# Patient Record
Sex: Male | Born: 1950
Health system: Southern US, Community
[De-identification: ages and names within clinical notes are randomized; demographics above are authoritative.]

## PROBLEM LIST (undated history)

## (undated) DIAGNOSIS — E785 Hyperlipidemia, unspecified: Secondary | ICD-10-CM

## (undated) DIAGNOSIS — T7840XA Allergy, unspecified, initial encounter: Secondary | ICD-10-CM

## (undated) HISTORY — PX: CYSTECTOMY: SUR359

## (undated) HISTORY — DX: Hyperlipidemia, unspecified: E78.5

## (undated) HISTORY — PX: KNEE SURGERY: SHX244

## (undated) HISTORY — DX: Allergy, unspecified, initial encounter: T78.40XA

---

## 2009-08-02 ENCOUNTER — Ambulatory Visit (HOSPITAL_COMMUNITY): Admission: RE | Admit: 2009-08-02 | Discharge: 2009-08-02 | Payer: Self-pay | Admitting: Sports Medicine

## 2009-08-08 ENCOUNTER — Ambulatory Visit (HOSPITAL_BASED_OUTPATIENT_CLINIC_OR_DEPARTMENT_OTHER): Admission: RE | Admit: 2009-08-08 | Discharge: 2009-08-08 | Payer: Self-pay | Admitting: Orthopedic Surgery

## 2009-10-09 ENCOUNTER — Ambulatory Visit: Payer: Self-pay | Admitting: Internal Medicine

## 2009-10-09 DIAGNOSIS — R030 Elevated blood-pressure reading, without diagnosis of hypertension: Secondary | ICD-10-CM

## 2009-10-09 DIAGNOSIS — J309 Allergic rhinitis, unspecified: Secondary | ICD-10-CM

## 2009-10-09 DIAGNOSIS — E785 Hyperlipidemia, unspecified: Secondary | ICD-10-CM

## 2009-10-09 LAB — HM COLONOSCOPY

## 2010-05-28 NOTE — Assessment & Plan Note (Signed)
Summary: to be est/njr   Vital Signs:  Patient profile:   60 year old male Height:      69 inches Weight:      200 pounds BMI:     29.64 Pulse rate:   58 / minute Pulse rhythm:   regular Resp:     12 per minute BP sitting:   148 / 80  (left arm) Cuff size:   regular  Vitals Entered By: Gladis Riffle, RN (October 09, 2009 9:16 AM)  Nutrition Counseling: Patient's BMI is greater than 25 and therefore counseled on weight management options. CC: to establish, moved from [place]--BP 135-140/85-90, cholesterol 220--knee surgery 6 weeks ago Is Patient Diabetic? No   CC:  to establish, moved from [place]--BP 135-140/85-90, and cholesterol 220--knee surgery 6 weeks ago.  History of Present Illness: new patient  lipids: cholesterol has been as high as 250---has been trying to lose weight  BP---gradually increasing  knee surgery---miniscus tear  All other systems reviewed and were negative   Preventive Screening-Counseling & Management  Alcohol-Tobacco     Smoking Status: quit     Year Started: 1974     Year Quit: 1982  Current Problems (verified): 1)  Allergic Rhinitis  (ICD-477.9)  Current Medications (verified): 1)  None  Allergies (verified): No Known Drug Allergies  Past History:  Past Medical History: Allergic rhinitis Hyperlipidemia  Past Surgical History: right knee meniscus 2011 pterygium cyst---eyelid  Family History: father deceased 4 ("old age") mother deceased Gerilyn Pilgrim [place]--60 yo  Social History: married 2 sons---healthy Former Smoker---quit 1980 Alcohol use-yes 2 glasses/week 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

## 2010-07-17 LAB — POCT HEMOGLOBIN-HEMACUE: Hemoglobin: 14.7 g/dL (ref 13.0–17.0)

## 2010-11-27 ENCOUNTER — Other Ambulatory Visit (INDEPENDENT_AMBULATORY_CARE_PROVIDER_SITE_OTHER): Payer: 59

## 2010-11-27 DIAGNOSIS — Z0000 Encounter for general adult medical examination without abnormal findings: Secondary | ICD-10-CM

## 2010-11-27 LAB — CBC WITH DIFFERENTIAL/PLATELET
Basophils Absolute: 0 10*3/uL (ref 0.0–0.1)
Basophils Relative: 0.6 % (ref 0.0–3.0)
Eosinophils Absolute: 0.3 10*3/uL (ref 0.0–0.7)
Eosinophils Relative: 5.5 % — ABNORMAL HIGH (ref 0.0–5.0)
Lymphs Abs: 1.1 10*3/uL (ref 0.7–4.0)
MCV: 93.5 fl (ref 78.0–100.0)
Neutro Abs: 3.9 10*3/uL (ref 1.4–7.7)
Platelets: 208 10*3/uL (ref 150.0–400.0)

## 2010-11-27 LAB — BASIC METABOLIC PANEL
BUN: 19 mg/dL (ref 6–23)
CO2: 31 mEq/L (ref 19–32)
Creatinine, Ser: 1 mg/dL (ref 0.4–1.5)
GFR: 78.23 mL/min (ref >60.00)
Sodium: 142 mEq/L (ref 135–145)

## 2010-11-27 LAB — LIPID PANEL: Cholesterol: 263 mg/dL — ABNORMAL HIGH (ref 0–200)

## 2010-11-27 LAB — POCT URINALYSIS DIPSTICK
Bilirubin, UA: NEGATIVE
Glucose, UA: NEGATIVE
Ketones, UA: NEGATIVE
Urobilinogen, UA: 0.2
pH, UA: 7

## 2010-11-27 LAB — TSH: TSH: 2.1 u[IU]/mL (ref 0.35–5.50)

## 2010-11-28 LAB — HEPATIC FUNCTION PANEL
AST: 21 U/L (ref 0–37)
Alkaline Phosphatase: 57 U/L (ref 39–117)
Total Bilirubin: 0.6 mg/dL (ref 0.3–1.2)

## 2010-11-29 ENCOUNTER — Encounter: Payer: Self-pay | Admitting: Internal Medicine

## 2010-12-04 ENCOUNTER — Encounter: Payer: Self-pay | Admitting: Internal Medicine

## 2010-12-04 ENCOUNTER — Ambulatory Visit (INDEPENDENT_AMBULATORY_CARE_PROVIDER_SITE_OTHER): Payer: 59 | Admitting: Internal Medicine

## 2010-12-04 VITALS — BP 140/85 | HR 72 | Temp 98.9°F | Ht 68.5 in | Wt 196.0 lb

## 2010-12-04 DIAGNOSIS — E785 Hyperlipidemia, unspecified: Secondary | ICD-10-CM

## 2010-12-04 DIAGNOSIS — Z0000 Encounter for general adult medical examination without abnormal findings: Secondary | ICD-10-CM

## 2010-12-04 MED ORDER — TADALAFIL 20 MG PO TABS: 20.0000 mg | ORAL_TABLET | ORAL | Status: DC | PRN

## 2010-12-04 NOTE — Progress Notes (Signed)
  Subjective:    Patient ID: Cameron Aguilar, male    DOB: [birth year], 60 y.o.   MRN: [id number]  HPI cpx  Home bps 120s/70s He feels well except for recent URI sxs  Past Medical History  Diagnosis Date  . Allergy   . Hyperlipidemia    Past Surgical History  Procedure Date  . Knee surgery   . Cystectomy     eyelid    reports that he quit smoking about 32 years ago. He does not have any smokeless tobacco history on file. He reports that he drinks alcohol. He reports that he does not use illicit drugs. family history is not on file. Not on File    Review of Systems  patient denies chest pain, shortness of breath, orthopnea. Denies lower extremity edema, abdominal pain, change in appetite, change in bowel movements. Patient denies rashes, musculoskeletal complaints. No other specific complaints in a complete review of systems.      Objective:   Physical Exam        Assessment & Plan:

## 2011-01-13 ENCOUNTER — Encounter: Payer: Self-pay | Admitting: Family Medicine

## 2011-01-13 ENCOUNTER — Ambulatory Visit (INDEPENDENT_AMBULATORY_CARE_PROVIDER_SITE_OTHER): Payer: 59 | Admitting: Family Medicine

## 2011-01-13 VITALS — BP 159/94 | Ht 70.0 in | Wt 195.0 lb

## 2011-01-13 DIAGNOSIS — M79609 Pain in unspecified limb: Secondary | ICD-10-CM

## 2011-01-13 DIAGNOSIS — M79669 Pain in unspecified lower leg: Secondary | ICD-10-CM

## 2011-01-13 NOTE — Progress Notes (Signed)
  Subjective:    Patient ID: Cameron Aguilar, male    DOB: 1950-08-13, 60 y.o.   MRN: [id number]  HPI  Right hand pain for 2 weeks. Started after a run of 2 miles. He completed 2 miles, stopped in turnaround to run back and felt a little bit of a pop and some instant pain in his calf. Was unable to run back and said he would have gladly take in a ride if one were available, but he did walk back. Did not notice bruising or swelling. The next day it felt stiff and he had some pain with going up stairs. It has gradually improved. Yesterday he hiked  with only some mild stiffness. He wonders if it is safe to return to running.  Runs about 15 miles per week. Comb[place] PERTINENT  PMH / PSH: No prior history of calf or Achilles tendon injury. No prior history of right leg surgery. Review of Systems Denies fever, denies unusual weight change.    Objective:   Physical Exam  GENERAL: Well-developed male no acute distress Right calf: Mildly tender to palpation over a small quarter-sized area at the distal calf muscle, right above where the Achilles tendon junction starts. Plantar flexion and dorsiflexion strength are intact in either causes pain. GAIT: Heel [employer]. Normal stride.  ULTRASOUND: The Achilles tendon is without any defect or increased vascularity. The area of tenderness corresponds to a very small area in the soleus muscle that had some increased fluid content consistent with muscle edema. I do not see a lot of increased vascularity in this area. It does not seem to be a specific muscle defect.      Assessment & Plan:  Mild soleus strain on the right. I will start him on a eccentric calf raises. He should do these for about a week specifically focusing on single leg stance on the right,  trying to do 3 sets 2-3 times a day. After about a week of strengthening I think he can return to running. Should he have any other problems he can return to clinic.

## 2011-03-06 ENCOUNTER — Other Ambulatory Visit: Payer: 59

## 2011-03-27 ENCOUNTER — Other Ambulatory Visit (INDEPENDENT_AMBULATORY_CARE_PROVIDER_SITE_OTHER): Payer: 59

## 2011-03-27 DIAGNOSIS — E785 Hyperlipidemia, unspecified: Secondary | ICD-10-CM

## 2011-03-27 LAB — HEPATIC FUNCTION PANEL
ALT: 24 U/L (ref 0–53)
Albumin: 4.4 g/dL (ref 3.5–5.2)
Alkaline Phosphatase: 49 U/L (ref 39–117)
Total Protein: 7.1 g/dL (ref 6.0–8.3)

## 2011-03-27 LAB — LIPID PANEL
HDL: 57.2 mg/dL (ref >39.00)
VLDL: 20.8 mg/dL (ref 0.0–40.0)

## 2011-03-27 LAB — LDL CHOLESTEROL, DIRECT: Direct LDL: 198.2 mg/dL

## 2011-04-14 ENCOUNTER — Encounter: Payer: Self-pay | Admitting: *Deleted

## 2011-05-08 ENCOUNTER — Telehealth: Payer: Self-pay

## 2011-05-08 MED ORDER — ATORVASTATIN CALCIUM 40 MG PO TABS: 40.0000 mg | ORAL_TABLET | Freq: Every day | ORAL | Status: DC

## 2011-05-08 NOTE — Telephone Encounter (Signed)
Pt called requesting lab results.  Pt is aware and pt would like Lipitor 40 mg sent to [facility].  Pt aware rx sent.

## 2011-06-11 ENCOUNTER — Telehealth: Payer: Self-pay | Admitting: Internal Medicine

## 2011-06-11 ENCOUNTER — Other Ambulatory Visit: Payer: Self-pay | Admitting: Internal Medicine

## 2011-06-11 NOTE — Telephone Encounter (Signed)
rx sent in electronically 

## 2011-06-11 NOTE — Telephone Encounter (Signed)
Needs a refill called in for CIALIS, claims that Dr. Cato Mulligan has prescribed this for him before. Please call it into [facility] in [place]. Call pt. If there are any questions.

## 2012-03-04 ENCOUNTER — Other Ambulatory Visit: Payer: Self-pay | Admitting: Internal Medicine

## 2012-06-09 ENCOUNTER — Telehealth: Payer: Self-pay | Admitting: Internal Medicine

## 2012-06-09 MED ORDER — ATORVASTATIN CALCIUM 40 MG PO TABS: 40.0000 mg | ORAL_TABLET | Freq: Every day | ORAL | Status: DC

## 2012-06-09 MED ORDER — TADALAFIL 20 MG PO TABS: ORAL_TABLET | ORAL | Status: DC

## 2012-06-09 NOTE — Telephone Encounter (Signed)
rx sent in electronically 

## 2012-06-09 NOTE — Telephone Encounter (Signed)
Pt needs refill of :atorvastatin (LIPITOR) 40 MG tablet and CIALIS 20 MG tablet.  Pt made appt for follow up for these meds, first available April1, 2014. Is this ok? Or does he need to see Adline Mango. Pharm: [facility]/ [address]

## 2012-07-27 ENCOUNTER — Ambulatory Visit (INDEPENDENT_AMBULATORY_CARE_PROVIDER_SITE_OTHER): Payer: 59 | Admitting: Internal Medicine

## 2012-07-27 ENCOUNTER — Encounter: Payer: Self-pay | Admitting: Internal Medicine

## 2012-07-27 VITALS — BP 132/82 | HR 60 | Temp 98.0°F | Ht 70.0 in | Wt 198.0 lb

## 2012-07-27 DIAGNOSIS — R7309 Other abnormal glucose: Secondary | ICD-10-CM

## 2012-07-27 DIAGNOSIS — E785 Hyperlipidemia, unspecified: Secondary | ICD-10-CM

## 2012-07-27 DIAGNOSIS — R030 Elevated blood-pressure reading, without diagnosis of hypertension: Secondary | ICD-10-CM

## 2012-07-27 DIAGNOSIS — R739 Hyperglycemia, unspecified: Secondary | ICD-10-CM

## 2012-07-27 LAB — HEPATIC FUNCTION PANEL
ALT: 20 U/L (ref 0–53)
AST: 21 U/L (ref 0–37)
Alkaline Phosphatase: 56 U/L (ref 39–117)
Bilirubin, Direct: 0.1 mg/dL (ref 0.0–0.3)
Total Protein: 7.2 g/dL (ref 6.0–8.3)

## 2012-07-27 LAB — BASIC METABOLIC PANEL
Potassium: 4.8 mEq/L (ref 3.5–5.1)
Sodium: 138 mEq/L (ref 135–145)

## 2012-07-27 LAB — LDL CHOLESTEROL, DIRECT: Direct LDL: 131.3 mg/dL

## 2012-07-27 LAB — LIPID PANEL
Total CHOL/HDL Ratio: 4
VLDL: 16.6 mg/dL (ref 0.0–40.0)

## 2012-07-27 MED ORDER — TADALAFIL 20 MG PO TABS: ORAL_TABLET | ORAL | Status: DC

## 2012-07-27 MED ORDER — ATORVASTATIN CALCIUM 40 MG PO TABS: 40.0000 mg | ORAL_TABLET | Freq: Every day | ORAL | Status: DC

## 2012-07-27 NOTE — Progress Notes (Signed)
Patient ID: Cameron Aguilar, male   DOB: [birth year], 61 y.o.   MRN: [id number] Noncompliant with meds (lipitor) and f/u  He feels well and would like refills of cialis  Reviewed pmh, psh, meds  Ros: no complaints  Exam:  well-developed well-nourished male in no acute distress. HEENT exam atraumatic, normocephalic, neck supple without jugular venous distention. Chest clear to auscultation cardiac exam S1-S2 are regular. Abdominal exam overweight with bowel sounds, soft and nontender. Extremities no edema. Neurologic exam is alert with a normal gait.   A/P: ED- ok to use cialis

## 2012-07-28 NOTE — Assessment & Plan Note (Signed)
BP Readings from Last 3 Encounters:  07/27/12 132/82  01/13/11 159/94  12/04/10 140/85  fair control- continue meds

## 2012-07-28 NOTE — Assessment & Plan Note (Signed)
Needs to be compliant with meds Check labs today

## 2012-07-30 ENCOUNTER — Encounter: Payer: Self-pay | Admitting: *Deleted

## 2013-01-17 ENCOUNTER — Ambulatory Visit (INDEPENDENT_AMBULATORY_CARE_PROVIDER_SITE_OTHER): Payer: 59 | Admitting: Family Medicine

## 2013-01-17 ENCOUNTER — Encounter: Payer: Self-pay | Admitting: Family Medicine

## 2013-01-17 VITALS — BP 142/88 | HR 62 | Temp 97.7°F | Wt 202.0 lb

## 2013-01-17 DIAGNOSIS — J069 Acute upper respiratory infection, unspecified: Secondary | ICD-10-CM

## 2013-01-17 MED ORDER — HYDROCODONE-HOMATROPINE 5-1.5 MG/5ML PO SYRP: 5.0000 mL | ORAL_SOLUTION | Freq: Four times a day (QID) | ORAL | Status: AC | PRN

## 2013-01-17 NOTE — Patient Instructions (Signed)
Viral Syndrome  You or your child has Viral Syndrome. It is the most common infection causing "colds" and infections in the nose, throat, sinuses, and breathing tubes. Sometimes the infection causes nausea, vomiting, or diarrhea. The germ that causes the infection is a virus. No antibiotic or other medicine will kill it. There are medicines that you can take to make you or your child more comfortable.   HOME CARE INSTRUCTIONS    Rest in bed until you start to feel better.   If you have diarrhea or vomiting, eat small amounts of crackers and toast. Soup is helpful.   Do not give aspirin or medicine that contains aspirin to children.   Only take over-the-counter or prescription medicines for pain, discomfort, or fever as directed by your caregiver.  SEEK IMMEDIATE MEDICAL CARE IF:    You or your child has not improved within one week.   You or your child has pain that is not at least partially relieved by over-the-counter medicine.   Thick, colored mucus or blood is coughed up.   Discharge from the nose becomes thick yellow or green.   Diarrhea or vomiting gets worse.   There is any major change in your or your child's condition.   You or your child develops a skin rash, stiff neck, severe headache, or are unable to hold down food or fluid.   You or your child has an oral temperature above 102 F (38.9 C), not controlled by medicine.   Your baby is older than 3 months with a rectal temperature of 102 F (38.9 C) or higher.   Your baby is 3 months old or younger with a rectal temperature of 100.4 F (38 C) or higher.  Document Released: 03/30/2006 Document Revised: 07/07/2011 Document Reviewed: 03/31/2007  ExitCare Patient Information 2014 [employer].

## 2013-01-17 NOTE — Progress Notes (Signed)
  Subjective:    Patient ID: Cameron Aguilar, male    DOB: [birth year], 62 y.o.   MRN: [id number]  HPI Acute visit for upper respiratory illness Onset last Friday. Developed a sore throat along with body aches and nasal congestion. Now has dry cough. Question low-grade fever over the weekend. Actually feels somewhat better today. He's taken multiple over-the-counter medications for congestion with minimal relief. Cough is still bothersome at night. Patient is nonsmoker. Still has some body aches but overall improving. Denies any nausea, vomiting, or diarrhea.  He was exposed to son with recent symptom which were very similar  Past Medical History  Diagnosis Date  . Allergy   . Hyperlipidemia    Past Surgical History  Procedure Laterality Date  . Knee surgery    . Cystectomy      eyelid    reports that he quit smoking about 34 years ago. He does not have any smokeless tobacco history on file. He reports that  drinks alcohol. He reports that he does not use illicit drugs. family history includes Heart disease (age of onset: 79) in his paternal grandfather. No Known Allergies    Review of Systems  Constitutional: Positive for fatigue. Negative for fever and chills.  HENT: Positive for congestion and sore throat.   Respiratory: Positive for cough. Negative for shortness of breath and wheezing.        Objective:   Physical Exam  Constitutional: He appears well-developed and well-nourished.  HENT:  Right Ear: External ear normal.  Left Ear: External ear normal.  Mouth/Throat: Oropharynx is clear and moist.  Neck: Neck supple.  Cardiovascular: Normal rate and regular rhythm.   Pulmonary/Chest: Effort normal and breath sounds normal. No respiratory distress. He has no wheezes. He has no rales.          Assessment & Plan:  Viral URI. Hycodan cough syrup for nighttime use as needed. Continue over-the-counter analgesics as needed. Follow up when necessary

## 2013-03-21 ENCOUNTER — Emergency Department (HOSPITAL_COMMUNITY)
Admission: EM | Admit: 2013-03-21 | Discharge: 2013-03-21 | Disposition: A | Discharge status: Home / Self Care | Payer: 59 | Attending: Emergency Medicine | Admitting: Emergency Medicine

## 2013-03-21 ENCOUNTER — Encounter (HOSPITAL_COMMUNITY): Payer: Self-pay | Admitting: Emergency Medicine

## 2013-03-21 DIAGNOSIS — R040 Epistaxis: Secondary | ICD-10-CM | POA: Insufficient documentation

## 2013-03-21 DIAGNOSIS — Z87891 Personal history of nicotine dependence: Secondary | ICD-10-CM | POA: Insufficient documentation

## 2013-03-21 DIAGNOSIS — Z79899 Other long term (current) drug therapy: Secondary | ICD-10-CM | POA: Insufficient documentation

## 2013-03-21 DIAGNOSIS — E785 Hyperlipidemia, unspecified: Secondary | ICD-10-CM | POA: Insufficient documentation

## 2013-03-21 MED ORDER — OXYMETAZOLINE HCL 0.05 % NA SOLN
2.0000 | Freq: Once | NASAL | Status: AC
  Administered 2013-03-21: 2 via NASAL
  Filled 2013-03-21: qty 15

## 2013-03-21 NOTE — ED Notes (Signed)
Pt states he used nasal irrigation last night d/t stuffy nose. States around 2 hours ago left nare began bleeding, has not been able to stop the bleeding. Not on anticoagulants.

## 2013-03-21 NOTE — ED Notes (Signed)
Nose bleed x 2 hours, previous history same

## 2013-03-21 NOTE — ED Provider Notes (Signed)
CSN: [id number]     Arrival date & time 03/21/13  1330 History   First MD Initiated Contact with Patient 03/21/13 1341     Chief Complaint  Patient presents with  . Epistaxis    x 2 hrs   (Consider location/radiation/quality/duration/timing/severity/associated sxs/prior Treatment) Patient is a 62 y.o. male presenting with nosebleeds. The history is provided by the patient and medical records.  Epistaxis  This is a 62 year old male with a past medical history significant for seasonal allergies and hyperlipidemia presenting to the ED for epistaxis. Patient states he used a netty-pot nasal lavage kit last night because he felt like his nose was dry, no bleeding at that time.  States 2 hours ago he began having an atraumatic nosebleed from his left nares.  States initially it was pouring from his nose but has been applying direct pressure and bleeding has slowed.  Pt not currently on any anti-coagulants.  Denies any dizziness, lightheadedness, or feelings of syncope.  Past Medical History  Diagnosis Date  . Allergy   . Hyperlipidemia    Past Surgical History  Procedure Laterality Date  . Knee surgery    . Cystectomy      eyelid   Family History  Problem Relation Age of Onset  . Heart disease Paternal Grandfather 40    deceased in 20s from heart disease   History  Substance Use Topics  . Smoking status: Former Smoker    Quit date: 04/28/1978  . Smokeless tobacco: Not on file  . Alcohol Use: Yes     Comment: 2 glasses per week    Review of Systems  HENT: Positive for nosebleeds.   All other systems reviewed and are negative.    Allergies  Review of patient's allergies indicates no known allergies.  Home Medications   Current Outpatient Rx  Name  Route  Sig  Dispense  Refill  . atorvastatin (LIPITOR) 40 MG tablet   Oral   Take 1 tablet (40 mg total) by mouth daily.   90 tablet   3   . tadalafil (CIALIS) 20 MG tablet      TAKE 1 TABLET BY MOUTH EVERY OTHER DAY AS  NEEDED   10 tablet   11    BP 188/93  Pulse 64  Temp(Src) 97.5 F (36.4 C) (Oral)  Resp 16  SpO2 97%  Physical Exam  Nursing note and vitals reviewed. Constitutional: He is oriented to person, place, and time. He appears well-developed and well-nourished. No distress.  HENT:  Head: Normocephalic and atraumatic.  Nose: No nasal septal hematoma. Epistaxis is observed.  Mouth/Throat: Oropharynx is clear and moist.  Minimal bleeding of left anterior nares with clot present; no septal deformity or hematoma  Eyes: Conjunctivae and EOM are normal. Pupils are equal, round, and reactive to light.  Neck: Normal range of motion.  Cardiovascular: Normal rate, regular rhythm and normal heart sounds.   Pulmonary/Chest: Effort normal and breath sounds normal. No respiratory distress. He has no wheezes.  Musculoskeletal: Normal range of motion.  Neurological: He is alert and oriented to person, place, and time.  Skin: Skin is warm and dry. He is not diaphoretic.  Psychiatric: He has a normal mood and affect.    ED Course  Procedures (including critical care time) Labs Review Labs Reviewed - No data to display Imaging Review No results found.  EKG Interpretation   None       MDM   1. Epistaxis    Afrin used with good  resolution of bleeding. No signs/sx concerning for acute blood loss anemia. Pt instructed to continue using at home if nosebleed recurs, but not for more than 3 consecutive days.  FU with PCP if problems occur.  Discussed plan with pt, he agreed.  Return precautions advised.  Garlon Hatchet, PA-C 03/21/13 1444

## 2013-03-21 NOTE — ED Notes (Signed)
Voiced understanding of instructions  

## 2013-03-22 ENCOUNTER — Other Ambulatory Visit: Payer: Self-pay | Admitting: *Deleted

## 2013-03-22 MED ORDER — ATORVASTATIN CALCIUM 40 MG PO TABS: 40.0000 mg | ORAL_TABLET | Freq: Every morning | ORAL | Status: DC

## 2013-03-23 NOTE — ED Provider Notes (Signed)
Medical screening examination/treatment/procedure(s) were performed by non-physician practitioner and as supervising physician I was immediately available for consultation/collaboration.  EKG Interpretation   None         Laray Anger, DO 03/23/13 2159

## 2013-05-27 ENCOUNTER — Encounter: Payer: Self-pay | Admitting: Family

## 2013-05-27 ENCOUNTER — Ambulatory Visit (INDEPENDENT_AMBULATORY_CARE_PROVIDER_SITE_OTHER): Payer: 59 | Admitting: Family

## 2013-05-27 VITALS — BP 132/84 | HR 93 | Temp 100.9°F | Wt 206.0 lb

## 2013-05-27 DIAGNOSIS — R509 Fever, unspecified: Secondary | ICD-10-CM

## 2013-05-27 DIAGNOSIS — J111 Influenza due to unidentified influenza virus with other respiratory manifestations: Secondary | ICD-10-CM

## 2013-05-27 DIAGNOSIS — R6889 Other general symptoms and signs: Secondary | ICD-10-CM

## 2013-05-27 LAB — POCT INFLUENZA A/B
INFLUENZA A, POC: POSITIVE
INFLUENZA B, POC: POSITIVE

## 2013-05-27 MED ORDER — OSELTAMIVIR PHOSPHATE 75 MG PO CAPS: 75.0000 mg | ORAL_CAPSULE | Freq: Two times a day (BID) | ORAL | Status: DC

## 2013-05-27 MED ORDER — GUAIFENESIN-CODEINE 100-10 MG/5ML PO SYRP: 5.0000 mL | ORAL_SOLUTION | Freq: Three times a day (TID) | ORAL | Status: DC | PRN

## 2013-05-27 NOTE — Patient Instructions (Signed)

## 2013-05-27 NOTE — Progress Notes (Signed)
   Subjective:    Patient ID: Cameron Aguilar, male    DOB: Mar 18, 1951, 63 y.o.   MRN: [id number]  HPI 63 year old white male, nonsmoker, patient of doctors for this event today with complaints of fever, cough, congestion, muscle aches and pain x3 days. Over-the-counter Mucinex without much relief. Travels on airplanes frequently.   Review of Systems  Constitutional: Positive for fever, chills and fatigue.  HENT: Negative.   Respiratory: Negative.   Cardiovascular: Negative.   Gastrointestinal: Negative.   Genitourinary: Negative.   Musculoskeletal: Positive for myalgias.  Skin: Negative.   Neurological: Negative.   Psychiatric/Behavioral: Negative.    Past Medical History  Diagnosis Date  . Allergy   . Hyperlipidemia     History   Social History  . Marital Status: Married    Spouse Name: N/A    Number of Children: N/A  . Years of Education: N/A   Occupational History  . Not on file.   Social History Main Topics  . Smoking status: Former Smoker    Quit date: 04/28/1978  . Smokeless tobacco: Not on file  . Alcohol Use: Yes     Comment: 2 glasses per week  . Drug Use: No  . Sexual Activity:    Other Topics Concern  . Not on file   Social History Narrative  . No narrative on file    Past Surgical History  Procedure Laterality Date  . Knee surgery    . Cystectomy      eyelid    Family History  Problem Relation Age of Onset  . Heart disease Paternal Grandfather 3    deceased in 107s from heart disease    No Known Allergies  Current Outpatient Prescriptions on File Prior to Visit  Medication Sig Dispense Refill  . atorvastatin (LIPITOR) 40 MG tablet Take 1 tablet (40 mg total) by mouth every morning.  90 tablet  1  . polyvinyl alcohol (LIQUIFILM TEARS) 1.4 % ophthalmic solution Place 2-3 drops into both eyes daily as needed for dry eyes.      . tadalafil (CIALIS) 20 MG tablet Take 20 mg by mouth daily as needed for erectile dysfunction.       No current  facility-administered medications on file prior to visit.    BP 132/84  Pulse 93  Temp(Src) 100.9 F (38.3 C) (Oral)  Wt 206 lb (93.441 kg)chart    Objective:   Physical Exam  Constitutional: He is oriented to person, place, and time. He appears well-developed.  Appears ill  HENT:  Right Ear: External ear normal.  Left Ear: External ear normal.  Nose: Nose normal.  Mouth/Throat: Oropharynx is clear and moist.  Neck: Normal range of motion. Neck supple.  Cardiovascular: Normal rate, regular rhythm and normal heart sounds.   Pulmonary/Chest: Effort normal and breath sounds normal.  Neurological: He is alert and oriented to person, place, and time.  Skin: Skin is warm and dry.  Psychiatric: He has a normal mood and affect.          Assessment & Plan:  Assessment: 1. Influenza-Tamiflu at patient's request 75 mg twice a day x5 days. Cheratussin 1 teaspoon every 8 hours as needed. Warned of drowsiness. 2. Fever-alternate ibuprofen and Tylenol as needed for fever

## 2013-05-27 NOTE — Progress Notes (Signed)
Pre visit review using our clinic review tool, if applicable. No additional management support is needed unless otherwise documented below in the visit note. 

## 2013-08-29 ENCOUNTER — Other Ambulatory Visit: Payer: Self-pay | Admitting: Internal Medicine

## 2013-11-08 ENCOUNTER — Encounter: Payer: Self-pay | Admitting: Physician Assistant

## 2013-11-08 ENCOUNTER — Ambulatory Visit (INDEPENDENT_AMBULATORY_CARE_PROVIDER_SITE_OTHER): Payer: 59 | Admitting: Physician Assistant

## 2013-11-08 VITALS — BP 132/76 | HR 66 | Temp 98.3°F | Resp 18 | Wt 208.0 lb

## 2013-11-08 DIAGNOSIS — J029 Acute pharyngitis, unspecified: Secondary | ICD-10-CM

## 2013-11-08 DIAGNOSIS — L989 Disorder of the skin and subcutaneous tissue, unspecified: Secondary | ICD-10-CM

## 2013-11-08 LAB — POCT RAPID STREP A (OFFICE): Rapid Strep A Screen: NEGATIVE

## 2013-11-08 MED ORDER — MOUTHWASH/GARGLE MT LIQD: OROMUCOSAL | Status: DC

## 2013-11-08 NOTE — Progress Notes (Signed)
Subjective:    Patient ID: Cameron Aguilar, male    DOB: 1950/11/10, 63 y.o.   MRN: [id number]  Sore Throat  This is a new problem. The current episode started in the past 7 days (3 days). The problem has been gradually worsening. Neither side of throat is experiencing more pain than the other. There has been no fever. The pain is at a severity of 3/10. The pain is mild. Associated symptoms include a hoarse voice and trouble swallowing. Pertinent negatives include no abdominal pain, diarrhea, drooling, ear discharge, ear pain, headaches, plugged ear sensation, neck pain, shortness of breath, stridor, swollen glands or vomiting. He has had no exposure to strep (Pts wife had similar a week ago.) or mono. Treatments tried: Alka Selzter Night time, benadryl. The treatment provided mild relief.    Pt also complaining of a wart on skin of leg. Present for about a month to 6 weeks. He states that he has scratched it and it bleeding. No pain, unless being palpated. Left lower leg. Has not tried anything for this.   Review of Systems  Constitutional: Negative for fever and chills.  HENT: Positive for hoarse voice, postnasal drip and trouble swallowing. Negative for drooling, ear discharge, ear pain and sinus pressure.   Respiratory: Negative for shortness of breath and stridor.   Cardiovascular: Negative for chest pain.  Gastrointestinal: Negative for nausea, vomiting, abdominal pain and diarrhea.  Musculoskeletal: Negative for neck pain.  Skin:       Lesion on leg  Neurological: Negative for headaches.  All other systems reviewed and are negative.    Past Medical History  Diagnosis Date  . Allergy   . Hyperlipidemia     History   Social History  . Marital Status: Married    Spouse Name: N/A    Number of Children: N/A  . Years of Education: N/A   Occupational History  . Not on file.   Social History Main Topics  . Smoking status: Former Smoker    Quit date: 04/28/1978  . Smokeless  tobacco: Not on file  . Alcohol Use: Yes     Comment: 2 glasses per week  . Drug Use: No  . Sexual Activity:    Other Topics Concern  . Not on file   Social History Narrative  . No narrative on file    Past Surgical History  Procedure Laterality Date  . Knee surgery    . Cystectomy      eyelid    Family History  Problem Relation Age of Onset  . Heart disease Paternal Grandfather 20    deceased in 59s from heart disease    No Known Allergies  Current Outpatient Prescriptions on File Prior to Visit  Medication Sig Dispense Refill  . atorvastatin (LIPITOR) 40 MG tablet Take 1 tablet (40 mg total) by mouth every morning.  90 tablet  1  . CIALIS 20 MG tablet TAKE 1 TABLET BY MOUTH EVERY OTHER DAY AS NEEDED  10 tablet  PRN  . polyvinyl alcohol (LIQUIFILM TEARS) 1.4 % ophthalmic solution Place 2-3 drops into both eyes daily as needed for dry eyes.      Marland Kitchen guaiFENesin-codeine (CHERATUSSIN AC) 100-10 MG/5ML syrup Take 5 mLs by mouth 3 (three) times daily as needed for cough.  120 mL  0   No current facility-administered medications on file prior to visit.    EXAM: BP 132/76  Pulse 66  Temp(Src) 98.3 F (36.8 C) (Oral)  Resp 18  Wt 208 lb (94.348 kg)  SpO2 98%     Objective:   Physical Exam  Nursing note and vitals reviewed. Constitutional: He is oriented to person, place, and time. He appears well-developed and well-nourished. No distress.  HENT:  Head: Normocephalic and atraumatic.  Right Ear: External ear normal.  Left Ear: External ear normal.  Nose: Nose normal.  Mouth/Throat: No oropharyngeal exudate.  Oropharynx is slightly erythematous, no exudate. Bilateral TMs normal. Bilateral frontal and maxillary sinuses non-TTP.  Eyes: Conjunctivae and EOM are normal. Pupils are equal, round, and reactive to light.  Neck: Normal range of motion. Neck supple. No JVD present.  Cardiovascular: Normal rate, regular rhythm and intact distal pulses.   Pulmonary/Chest:  Effort normal and breath sounds normal. No stridor. No respiratory distress. He exhibits no tenderness.  Musculoskeletal: Normal range of motion. He exhibits no edema.  Lymphadenopathy:    He has no cervical adenopathy.  Neurological: He is alert and oriented to person, place, and time.  Skin: Skin is warm and dry. No rash noted. He is not diaphoretic. No erythema. No pallor.  Single 1 cm diameter lesion with scabbing posterior left lower leg. No erythema, TTP, swelling, excessive warmth, or fluctuance.  Psychiatric: He has a normal mood and affect. His behavior is normal. Judgment and thought content normal.    Lab Results  Component Value Date   WBC 5.7 11/27/2010   HGB 13.3 11/27/2010   HCT 40.1 11/27/2010   PLT 208.0 11/27/2010   GLUCOSE 102* 07/27/2012   CHOL 201* 07/27/2012   TRIG 83.0 07/27/2012   HDL 54.80 07/27/2012   LDLDIRECT 131.3 07/27/2012   ALT 20 07/27/2012   AST 21 07/27/2012   NA 138 07/27/2012   K 4.8 07/27/2012   CL 102 07/27/2012   CREATININE 0.9 07/27/2012   BUN 19 07/27/2012   CO2 29 07/27/2012   TSH 2.10 11/27/2010   PSA 1.34 11/27/2010   HGBA1C 5.8 07/27/2012         Assessment & Plan:  Lc was seen today for sore throat and left leg sore.  Diagnoses and associated orders for this visit:  Sore throat Comments: Rapid strep negative. Will culture. Called in mouth wash to pharmacy. (1:1:1 maalox, benadryl, lidocaine) - POC Rapid Strep A - Mouthwashes (MOUTHWASH/GARGLE) LIQD; 1 teaspoon gargle and spit every 6 to 8 hours as needed for sore throat. - Throat culture [facility] [address])  Skin lesion Comments: Present for over a month, scabbing, not resolving. No sign of infection. Will refer to dermatology. - Ambulatory referral to Dermatology    Pt provided handout as well to call dermatology to schedule appointment.  Pt will also try throat sprays/lozenges, and salt water gargles for symptoms relief.  Return precautions provided, and patient handout on pharyngitis.  Plan to follow up  as needed, or for worsening or persistent symptoms despite treatment.  Patient Instructions  We will call with the throat culture results when they are available.  You need to call the Dermatologist provided in the pamphlet to schedule an appointment for evaluation of the skin lesion on your leg.  1:1:1 Benadryl, maalox, lidocaine has been called in to your pharmacy. This has to be hand mixed. THe direction are 1 teaspoon gargled and spit out every 8 hours as needed for sore throat.  You may also find relief with throat spray, throat lozenges, and salt water gargles.  Plain Over the Counter Mucinex (NOT Mucinex D) for thick secretions  Force NON dairy fluids, drinking plenty  of water is best.    Over the Counter Flonase OR Nasacort AQ 1 spray in each nostril twice a day as needed. Use the "crossover" technique into opposite nostril spraying toward opposite ear @ 45 degree angle, not straight up into nostril.   Plain Over the Counter Allegra (NOT D )  160 daily , OR Loratidine 10 mg , OR Zyrtec 10 mg @ bedtime  as needed for itchy eyes & sneezing.  Saline Irrigation and Saline Sprays can also help reduce symptoms.  If emergency symptoms discussed during visit developed, seek medical attention immediately.  Followup as needed, or for worsening or persistent symptoms despite treatment.

## 2013-11-08 NOTE — Progress Notes (Signed)
Pre visit review using our clinic review tool, if applicable. No additional management support is needed unless otherwise documented below in the visit note. 

## 2013-11-08 NOTE — Patient Instructions (Signed)
We will call with the throat culture results when they are available.  You need to call the Dermatologist provided in the pamphlet to schedule an appointment for evaluation of the skin lesion on your leg.  1:1:1 Benadryl, maalox, lidocaine has been called in to your pharmacy. This has to be hand mixed. THe direction are 1 teaspoon gargled and spit out every 8 hours as needed for sore throat.  You may also find relief with throat spray, throat lozenges, and salt water gargles.  Plain Over the Counter Mucinex (NOT Mucinex D) for thick secretions  Force NON dairy fluids, drinking plenty of water is best.    Over the Counter Flonase OR Nasacort AQ 1 spray in each nostril twice a day as needed. Use the "crossover" technique into opposite nostril spraying toward opposite ear @ 45 degree angle, not straight up into nostril.   Plain Over the Counter Allegra (NOT D )  160 daily , OR Loratidine 10 mg , OR Zyrtec 10 mg @ bedtime  as needed for itchy eyes & sneezing.  Saline Irrigation and Saline Sprays can also help reduce symptoms.  If emergency symptoms discussed during visit developed, seek medical attention immediately.  Followup as needed, or for worsening or persistent symptoms despite treatment.    Pharyngitis Pharyngitis is a sore throat (pharynx). There is redness, pain, and swelling of your throat. HOME CARE   Drink enough fluids to keep your pee (urine) clear or pale yellow.  Only take medicine as told by your doctor.  You may get sick again if you do not take medicine as told. Finish your medicines, even if you start to feel better.  Do not take aspirin.  Rest.  Rinse your mouth (gargle) with salt water ( tsp of salt per 1 qt of water) every 1-2 hours. This will help the pain.  If you are not at risk for choking, you can suck on hard candy or sore throat lozenges. GET HELP IF:  You have large, tender lumps on your neck.  You have a rash.  You cough up green,  yellow-brown, or bloody spit. GET HELP RIGHT AWAY IF:   You have a stiff neck.  You drool or cannot swallow liquids.  You throw up (vomit) or are not able to keep medicine or liquids down.  You have very bad pain that does not go away with medicine.  You have problems breathing (not from a stuffy nose). MAKE SURE YOU:   Understand these instructions.  Will watch your condition.  Will get help right away if you are not doing well or get worse. Document Released: 10/01/2007 Document Revised: 02/02/2013 Document Reviewed: 12/20/2012 [facility] Patient Information 2015 [place], [place]. This information is not intended to replace advice given to you by your health care provider. Make sure you discuss any questions you have with your health care provider.

## 2013-11-10 LAB — CULTURE, GROUP A STREP: Organism ID, Bacteria: NORMAL

## 2013-12-22 ENCOUNTER — Ambulatory Visit (INDEPENDENT_AMBULATORY_CARE_PROVIDER_SITE_OTHER): Payer: 59 | Admitting: Physician Assistant

## 2013-12-22 ENCOUNTER — Encounter: Payer: Self-pay | Admitting: Physician Assistant

## 2013-12-22 VITALS — BP 120/80 | HR 60 | Temp 98.0°F | Resp 18 | Wt 206.6 lb

## 2013-12-22 DIAGNOSIS — J069 Acute upper respiratory infection, unspecified: Secondary | ICD-10-CM

## 2013-12-22 MED ORDER — MOUTHWASH/GARGLE MT LIQD: OROMUCOSAL | Status: DC

## 2013-12-22 MED ORDER — HYDROCODONE-HOMATROPINE 5-1.5 MG/5ML PO SYRP: 5.0000 mL | ORAL_SOLUTION | Freq: Every evening | ORAL | Status: DC | PRN

## 2013-12-22 MED ORDER — CEFUROXIME AXETIL 500 MG PO TABS: 500.0000 mg | ORAL_TABLET | Freq: Two times a day (BID) | ORAL | Status: DC

## 2013-12-22 NOTE — Patient Instructions (Addendum)
Magic mouthwash gargle and spit 1 teaspoon every 8 hours as needed.  Hycodan cough syrup at night to help with cough symptoms. You cannot drive while taking this medication as it will inhibit your ability to operate a motor vehicle.  Plain Over the Counter Mucinex (NOT Mucinex D) for thick secretions  Force NON dairy fluids, drinking plenty of water is best.    Over the Counter Flonase OR Nasacort AQ 1 spray in each nostril twice a day as needed. Use the "crossover" technique into opposite nostril spraying toward opposite ear @ 45 degree angle, not straight up into nostril.   Plain Over the Counter Allegra (NOT D )  160 daily , OR Loratidine 10 mg , OR Zyrtec 10 mg @ bedtime  as needed for itchy eyes & sneezing.  Saline Irrigation and Saline Sprays can also help reduce symptoms.  Ceftin Twice daily for 10 days if your symptoms do not improve despite conservative therapy.  If emergency symptoms discussed during visit developed, seek medical attention immediately.  Followup as needed, or for worsening or persistent symptoms despite treatment.     Upper Respiratory Infection, Adult An upper respiratory infection (URI) is also known as the common cold. It is often caused by a type of germ (virus). Colds are easily spread (contagious). You can pass it to others by kissing, coughing, sneezing, or drinking out of the same glass. Usually, you get better in 1 or 2 weeks.  HOME CARE   Only take medicine as told by your doctor.  Use a warm mist humidifier or breathe in steam from a hot shower.  Drink enough water and fluids to keep your pee (urine) clear or pale yellow.  Get plenty of rest.  Return to work when your temperature is back to normal or as told by your doctor. You may use a face mask and wash your hands to stop your cold from spreading. GET HELP RIGHT AWAY IF:   After the first few days, you feel you are getting worse.  You have questions about your medicine.  You have  chills, shortness of breath, or brown or red spit (mucus).  You have yellow or brown snot (nasal discharge) or pain in the face, especially when you bend forward.  You have a fever, puffy (swollen) neck, pain when you swallow, or white spots in the back of your throat.  You have a bad headache, ear pain, sinus pain, or chest pain.  You have a high-pitched whistling sound when you breathe in and out (wheezing).  You have a lasting cough or cough up blood.  You have sore muscles or a stiff neck. MAKE SURE YOU:   Understand these instructions.  Will watch your condition.  Will get help right away if you are not doing well or get worse. Document Released: 10/01/2007 Document Revised: 07/07/2011 Document Reviewed: 07/20/2013 [facility] Patient Information 2015 [place], [place]. This information is not intended to replace advice given to you by your health care provider. Make sure you discuss any questions you have with your health care provider.

## 2013-12-22 NOTE — Progress Notes (Signed)
Pre visit review using our clinic review tool, if applicable. No additional management support is needed unless otherwise documented below in the visit note. 

## 2013-12-22 NOTE — Progress Notes (Signed)
Subjective:    Patient ID: Cameron Aguilar, male    DOB: March 02, 1951, 63 y.o.   MRN: [id number]  URI  This is a new problem. The current episode started in the past 7 days (4 days). The problem has been gradually worsening. There has been no fever. Associated symptoms include congestion, coughing (dry), rhinorrhea, sinus pain, a sore throat and swollen glands. Pertinent negatives include no abdominal pain, chest pain, diarrhea, dysuria, ear pain, headaches, joint pain, joint swelling, nausea, neck pain, plugged ear sensation, rash, sneezing, vomiting or wheezing. Treatments tried: cough drops, mucinex. The treatment provided mild relief.      Review of Systems  Constitutional: Negative for fever and chills.  HENT: Positive for congestion, postnasal drip, rhinorrhea, sinus pressure and sore throat. Negative for ear pain and sneezing.   Respiratory: Positive for cough (dry). Negative for wheezing.   Cardiovascular: Negative for chest pain.  Gastrointestinal: Negative for nausea, vomiting, abdominal pain and diarrhea.  Genitourinary: Negative for dysuria.  Musculoskeletal: Negative for joint pain and neck pain.  Skin: Negative for rash.  Neurological: Negative for syncope and headaches.  All other systems reviewed and are negative.  Past Medical History  Diagnosis Date  . Allergy   . Hyperlipidemia     History   Social History  . Marital Status: Married    Spouse Name: N/A    Number of Children: N/A  . Years of Education: N/A   Occupational History  . Not on file.   Social History Main Topics  . Smoking status: Former Smoker    Quit date: 04/28/1978  . Smokeless tobacco: Not on file  . Alcohol Use: Yes     Comment: 2 glasses per week  . Drug Use: No  . Sexual Activity:    Other Topics Concern  . Not on file   Social History Narrative  . No narrative on file    Past Surgical History  Procedure Laterality Date  . Knee surgery    . Cystectomy      eyelid    Family  History  Problem Relation Age of Onset  . Heart disease Paternal Grandfather 9    deceased in 61s from heart disease    No Known Allergies  Current Outpatient Prescriptions on File Prior to Visit  Medication Sig Dispense Refill  . atorvastatin (LIPITOR) 40 MG tablet Take 1 tablet (40 mg total) by mouth every morning.  90 tablet  1  . CIALIS 20 MG tablet TAKE 1 TABLET BY MOUTH EVERY OTHER DAY AS NEEDED  10 tablet  PRN  . polyvinyl alcohol (LIQUIFILM TEARS) 1.4 % ophthalmic solution Place 2-3 drops into both eyes daily as needed for dry eyes.      Marland Kitchen guaiFENesin-codeine (CHERATUSSIN AC) 100-10 MG/5ML syrup Take 5 mLs by mouth 3 (three) times daily as needed for cough.  120 mL  0   No current facility-administered medications on file prior to visit.    EXAM: BP 120/80  Pulse 60  Temp(Src) 98 F (36.7 C) (Oral)  Resp 18  Wt 206 lb 9.6 oz (93.713 kg)     Objective:   Physical Exam  Nursing note and vitals reviewed. Constitutional: He is oriented to person, place, and time. He appears well-developed and well-nourished. No distress.  HENT:  Head: Normocephalic and atraumatic.  Right Ear: External ear normal.  Left Ear: External ear normal.  Nose: Nose normal.  Mouth/Throat: No oropharyngeal exudate.  Oropharynx is slightly erythematous, no exudate. Bilateral TMs normal.  Bilateral frontal and maxillary sinuses non-TTP.  Eyes: Conjunctivae and EOM are normal. Pupils are equal, round, and reactive to light.  Neck: Normal range of motion. Neck supple.  Cardiovascular: Normal rate, regular rhythm and intact distal pulses.   Pulmonary/Chest: Effort normal and breath sounds normal. No stridor. No respiratory distress. He has no wheezes. He has no rales. He exhibits no tenderness.  Lymphadenopathy:    He has no cervical adenopathy.  Neurological: He is alert and oriented to person, place, and time.  Skin: Skin is warm and dry. He is not diaphoretic.  Psychiatric: He has a normal mood  and affect. His behavior is normal. Judgment and thought content normal.     Lab Results  Component Value Date   WBC 5.7 11/27/2010   HGB 13.3 11/27/2010   HCT 40.1 11/27/2010   PLT 208.0 11/27/2010   GLUCOSE 102* 07/27/2012   CHOL 201* 07/27/2012   TRIG 83.0 07/27/2012   HDL 54.80 07/27/2012   LDLDIRECT 131.3 07/27/2012   ALT 20 07/27/2012   AST 21 07/27/2012   NA 138 07/27/2012   K 4.8 07/27/2012   CL 102 07/27/2012   CREATININE 0.9 07/27/2012   BUN 19 07/27/2012   CO2 29 07/27/2012   TSH 2.10 11/27/2010   PSA 1.34 11/27/2010   HGBA1C 5.8 07/27/2012        Assessment & Plan:  Cameron Aguilar was seen today for uri.  Diagnoses and associated orders for this visit:  URI (upper respiratory infection) Comments: Will treat with magic mouthwash (1:1:1 maalox, benadryl, lidocaine), and hycodan for cough. Add otc mucinex, antihistamine, nasal steroid, rest fluids. - Mouthwashes (MOUTHWASH/GARGLE) LIQD; 1 teaspoon gargle and spit every 6 to 8 hours as needed for sore throat. - HYDROcodone-homatropine (HYCODAN) 5-1.5 MG/5ML syrup; Take 5 mLs by mouth at bedtime as needed for cough. - cefUROXime (CEFTIN) 500 MG tablet; Take 1 tablet (500 mg total) by mouth 2 (two) times daily.    Patient questioned needing an antibiotic, however currently this did not seem appropriate. He requested an antibiotic for a few days out if she is not any better. Provided patient with a prescription of Ceftin to fill in 4 days if he is not feeling any better.  Return precautions provided, and patient handout on URI.  Plan to follow up as needed, or for worsening or persistent symptoms despite treatment.  Patient Instructions  Magic mouthwash gargle and spit 1 teaspoon every 8 hours as needed.  Hycodan cough syrup at night to help with cough symptoms. You cannot drive while taking this medication as it will inhibit your ability to operate a motor vehicle.  Plain Over the Counter Mucinex (NOT Mucinex D) for thick secretions  Force NON dairy  fluids, drinking plenty of water is best.    Over the Counter Flonase OR Nasacort AQ 1 spray in each nostril twice a day as needed. Use the "crossover" technique into opposite nostril spraying toward opposite ear @ 45 degree angle, not straight up into nostril.   Plain Over the Counter Allegra (NOT D )  160 daily , OR Loratidine 10 mg , OR Zyrtec 10 mg @ bedtime  as needed for itchy eyes & sneezing.  Saline Irrigation and Saline Sprays can also help reduce symptoms.  Ceftin Twice daily for 10 days if your symptoms do not improve despite conservative therapy.  If emergency symptoms discussed during visit developed, seek medical attention immediately.  Followup as needed, or for worsening or persistent symptoms despite treatment.

## 2014-08-10 ENCOUNTER — Other Ambulatory Visit: Payer: Self-pay | Admitting: Internal Medicine

## 2014-09-19 ENCOUNTER — Telehealth: Payer: Self-pay | Admitting: Internal Medicine

## 2014-09-19 MED ORDER — CIALIS 20 MG PO TABS: ORAL_TABLET | ORAL | Status: DC

## 2014-09-19 MED ORDER — ATORVASTATIN CALCIUM 40 MG PO TABS: 40.0000 mg | ORAL_TABLET | Freq: Every morning | ORAL | Status: DC

## 2014-09-19 NOTE — Telephone Encounter (Signed)
rx sent in electronically to [facility]

## 2014-09-19 NOTE — Telephone Encounter (Signed)
Pt request refill of the following: atorvastatin (LIPITOR) 40 MG tablet   CIALIS 20 MG tablet   Jenny Reichmann he has an appt with Tommi Rumps on 10/04/14   Phamacy: [facility]

## 2014-10-04 ENCOUNTER — Encounter: Payer: Self-pay | Admitting: Adult Health

## 2014-10-04 ENCOUNTER — Ambulatory Visit (INDEPENDENT_AMBULATORY_CARE_PROVIDER_SITE_OTHER): Payer: 59 | Admitting: Adult Health

## 2014-10-04 VITALS — BP 116/70 | Temp 98.1°F | Ht 70.0 in | Wt 208.0 lb

## 2014-10-04 DIAGNOSIS — Z7189 Other specified counseling: Secondary | ICD-10-CM | POA: Diagnosis not present

## 2014-10-04 DIAGNOSIS — Z760 Encounter for issue of repeat prescription: Secondary | ICD-10-CM | POA: Diagnosis not present

## 2014-10-04 DIAGNOSIS — Z7689 Persons encountering health services in other specified circumstances: Secondary | ICD-10-CM

## 2014-10-04 MED ORDER — ATORVASTATIN CALCIUM 40 MG PO TABS: 40.0000 mg | ORAL_TABLET | Freq: Every morning | ORAL | Status: DC

## 2014-10-04 MED ORDER — CIALIS 20 MG PO TABS: ORAL_TABLET | ORAL | Status: DC

## 2014-10-04 NOTE — Patient Instructions (Addendum)
It was great meeting you today!   Continue to exercise and eat a healthy diet. Try and cut back on the red meat- after you get back from vacation.   Follow up with me in July for a complete physical and call me if you need anything.    Health Maintenance A healthy lifestyle and preventative care can promote health and wellness.  Maintain regular health, dental, and eye exams.  Eat a healthy diet. Foods like vegetables, fruits, whole grains, low-fat dairy products, and lean protein foods contain the nutrients you need and are low in calories. Decrease your intake of foods high in solid fats, added sugars, and salt. Get information about a proper diet from your health care provider, if necessary.  Regular physical exercise is one of the most important things you can do for your health. Most adults should get at least 150 minutes of moderate-intensity exercise (any activity that increases your heart rate and causes you to sweat) each week. In addition, most adults need muscle-strengthening exercises on 2 or more days a week.   Maintain a healthy weight. The body mass index (BMI) is a screening tool to identify possible weight problems. It provides an estimate of body fat based on height and weight. Your health care provider can find your BMI and can help you achieve or maintain a healthy weight. For males 20 years and older:  A BMI below 18.5 is considered underweight.  A BMI of 18.5 to 24.9 is normal.  A BMI of 25 to 29.9 is considered overweight.  A BMI of 30 and above is considered obese.  Maintain normal blood lipids and cholesterol by exercising and minimizing your intake of saturated fat. Eat a balanced diet with plenty of fruits and vegetables. Blood tests for lipids and cholesterol should begin at age 37 and be repeated every 5 years. If your lipid or cholesterol levels are high, you are over age 43, or you are at high risk for heart disease, you may need your cholesterol levels  checked more frequently.Ongoing high lipid and cholesterol levels should be treated with medicines if diet and exercise are not working.  If you smoke, find out from your health care provider how to quit. If you do not use tobacco, do not start.  Lung cancer screening is recommended for adults aged 1-80 years who are at high risk for developing lung cancer because of a history of smoking. A yearly low-dose CT scan of the lungs is recommended for people who have at least a 30-pack-year history of smoking and are current smokers or have quit within the past 15 years. A pack year of smoking is smoking an average of 1 pack of cigarettes a day for 1 year (for example, a 30-pack-year history of smoking could mean smoking 1 pack a day for 30 years or 2 packs a day for 15 years). Yearly screening should continue until the smoker has stopped smoking for at least 15 years. Yearly screening should be stopped for people who develop a health problem that would prevent them from having lung cancer treatment.  If you choose to drink alcohol, do not have more than 2 drinks per day. One drink is considered to be 12 oz (360 mL) of beer, 5 oz (150 mL) of wine, or 1.5 oz (45 mL) of liquor.  Avoid the use of street drugs. Do not share needles with anyone. Ask for help if you need support or instructions about stopping the use of drugs.  High blood pressure causes heart disease and increases the risk of stroke. Blood pressure should be checked at least every 1-2 years. Ongoing high blood pressure should be treated with medicines if weight loss and exercise are not effective.  If you are 2-37 years old, ask your health care provider if you should take aspirin to prevent heart disease.  Diabetes screening involves taking a blood sample to check your fasting blood sugar level. This should be done once every 3 years after age 28 if you are at a normal weight and without risk factors for diabetes. Testing should be considered  at a younger age or be carried out more frequently if you are overweight and have at least 1 risk factor for diabetes.  Colorectal cancer can be detected and often prevented. Most routine colorectal cancer screening begins at the age of 23 and continues through age 30. However, your health care provider may recommend screening at an earlier age if you have risk factors for colon cancer. On a yearly basis, your health care provider may provide home test kits to check for hidden blood in the stool. A small camera at the end of a tube may be used to directly examine the colon (sigmoidoscopy or colonoscopy) to detect the earliest forms of colorectal cancer. Talk to your health care provider about this at age 30 when routine screening begins. A direct exam of the colon should be repeated every 5-10 years through age 60, unless early forms of precancerous polyps or small growths are found.  People who are at an increased risk for hepatitis B should be screened for this virus. You are considered at high risk for hepatitis B if:  You were born in a country where hepatitis B occurs often. Talk with your health care provider about which countries are considered high risk.  Your parents were born in a high-risk country and you have not received a shot to protect against hepatitis B (hepatitis B vaccine).  You have HIV or AIDS.  You use needles to inject street drugs.  You live with, or have sex with, someone who has hepatitis B.  You are a man who has sex with other men (MSM).  You get hemodialysis treatment.  You take certain medicines for conditions like cancer, organ transplantation, and autoimmune conditions.  Hepatitis C blood testing is recommended for all people born from [birth year] through 1965 and any individual with known risk factors for hepatitis C.  Healthy men should no longer receive prostate-specific antigen (PSA) blood tests as part of routine cancer screening. Talk to your health care  provider about prostate cancer screening.  Testicular cancer screening is not recommended for adolescents or adult males who have no symptoms. Screening includes self-exam, a health care provider exam, and other screening tests. Consult with your health care provider about any symptoms you have or any concerns you have about testicular cancer.  Practice safe sex. Use condoms and avoid high-risk sexual practices to reduce the spread of sexually transmitted infections (STIs).  You should be screened for STIs, including gonorrhea and chlamydia if:  You are sexually active and are younger than 24 years.  You are older than 24 years, and your health care provider tells you that you are at risk for this type of infection.  Your sexual activity has changed since you were last screened, and you are at an increased risk for chlamydia or gonorrhea. Ask your health care provider if you are at risk.  If you are  at risk of being infected with HIV, it is recommended that you take a prescription medicine daily to prevent HIV infection. This is called pre-exposure prophylaxis (PrEP). You are considered at risk if:  You are a man who has sex with other men (MSM).  You are a heterosexual man who is sexually active with multiple partners.  You take drugs by injection.  You are sexually active with a partner who has HIV.  Talk with your health care provider about whether you are at high risk of being infected with HIV. If you choose to begin PrEP, you should first be tested for HIV. You should then be tested every 3 months for as long as you are taking PrEP.  Use sunscreen. Apply sunscreen liberally and repeatedly throughout the day. You should seek shade when your shadow is shorter than you. Protect yourself by wearing long sleeves, pants, a wide-brimmed hat, and sunglasses year round whenever you are outdoors.  Tell your health care provider of new moles or changes in moles, especially if there is a change  in shape or color. Also, tell your health care provider if a mole is larger than the size of a pencil eraser.  A one-time screening for abdominal aortic aneurysm (AAA) and surgical repair of large AAAs by ultrasound is recommended for men aged 57-75 years who are current or former smokers.  Stay current with your vaccines (immunizations). Document Released: 10/11/2007 Document Revised: 04/19/2013 Document Reviewed: 09/09/2010 [facility] Patient Information 2015 [place], [place]. This information is not intended to replace advice given to you by your health care provider. Make sure you discuss any questions you have with your health care provider.

## 2014-10-04 NOTE — Progress Notes (Signed)
Pre visit review using our clinic review tool, if applicable. No additional management support is needed unless otherwise documented below in the visit note. 

## 2014-10-04 NOTE — Progress Notes (Signed)
HPI:  Cameron Aguilar is here to establish care.  Last PCP and physical: Was with Dr. Leanne Chang in 4/14  Has the following chronic problems that require follow up and concerns today:  His only concern is that he took a break from exercising and he is find that now that he is getting back into running, that he is having a hard time running long distances. He does endorse 15 pound weight gain since stopping exercising.   Denies any unusual SOB or CP while running. No lower extremity claudication.   Denies any other issues.     ROS negative for unless reported above: fevers, chills,feeling poorly, unintentional weight loss, hearing or vision loss, chest pain, palpitations, leg claudication, struggling to breath,Not feeling congested in the chest, no orthopenia, no cough,no wheezing, normal appetite, no soft tissue swelling, no hemoptysis, melena, hematochezia, hematuria, falls, loc, si, or thoughts of self harm.  Immunizations:Needs Shingles Diet:Eats vegetables but eats a lot of red meat- 2-3 times a week. Does not eat out a lot, except when traveling Exercise:Is starting to run more. Colonoscopy:2011- Normal   Past Medical History  Diagnosis Date  . Allergy   . Hyperlipidemia     Past Surgical History  Procedure Laterality Date  . Knee surgery    . Cystectomy      eyelid    Family History  Problem Relation Age of Onset  . Heart disease Paternal Grandfather 26    deceased in 52s from heart disease    History   Social History  . Marital Status: Married    Spouse Name: N/A  . Number of Children: N/A  . Years of Education: N/A   Social History Main Topics  . Smoking status: Former Smoker    Quit date: 04/28/1978  . Smokeless tobacco: Not on file  . Alcohol Use: Yes     Comment: 2 glasses per week  . Drug Use: No  . Sexual Activity: Not on file   Other Topics Concern  . None   Social History Narrative     Current outpatient prescriptions:  .  atorvastatin  (LIPITOR) 40 MG tablet, Take 1 tablet (40 mg total) by mouth every morning., Disp: 30 tablet, Rfl: 0 .  CIALIS 20 MG tablet, TAKE 1 TABLET BY MOUTH EVERY OTHER DAY AS NEEDED, Disp: 10 tablet, Rfl: 0   EXAM:  Filed Vitals:   10/04/14 1300  BP: 116/70  Temp: 98.1 F (36.7 C)    Body mass index is 29.84 kg/(m^2).  GENERAL: vitals reviewed and listed above, alert, oriented, appears well hydrated and in no acute distress  HEENT: atraumatic, conjunttiva clear, no obvious abnormalities on inspection of external nose and ears  NECK: Neck is soft and supple without masses, no adenopathy or thyromegaly, trachea midline, no JVD. Normal range of motion.   LUNGS: clear to auscultation bilaterally, no wheezes, rales or rhonchi, good air movement  CV: Regular rate and rhythm, normal S1/S2, no audible murmurs, gallops, or rubs. No carotid bruit and no peripheral edema.   MS: moves all extremities without noticeable abnormality. No edema noted  Abd: soft/nontender/nondistended/normal bowel sounds   Skin: warm and dry, no rash   Extremities: No clubbing, cyanosis, or edema. Capillary refill is WNL. Pulses intact bilaterally in upper and lower extremities.   Neuro: CN II-XII intact, sensation and reflexes normal throughout, 5/5 muscle strength in bilateral upper and lower extremities. Normal finger to nose. Normal rapid alternating movements.    PSYCH: pleasant and  cooperative, no obvious depression or anxiety  ASSESSMENT AND PLAN:  1. Encounter to establish care - Follow up in 4 weeks for CPE - Follow up sooner if needed - Continue to exercise and incorporate a heart healthy diet into your life.  - Go to the ER with any CP or SOB  2. Medication refill - atorvastatin (LIPITOR) 40 MG tablet; Take 1 tablet (40 mg total) by mouth every morning.  Dispense: 90 tablet; Refill: 3 - CIALIS 20 MG tablet; TAKE 1 TABLET BY MOUTH EVERY OTHER DAY AS NEEDED  Dispense: 10 tablet; Refill:  11   Discussed the following assessment and plan:  No diagnosis found. -We reviewed the PMH, PSH, FH, SH, Meds and Allergies. -We provided refills for any medications we will prescribe as needed. -We addressed current concerns per orders and patient instructions. -We have asked for records for pertinent exams, studies, vaccines and notes from previous providers. -We have advised patient to follow up per instructions below.   -Patient advised to return or notify a provider immediately if symptoms worsen or persist or new concerns arise.  There are no Patient Instructions on file for this visit.   [employer]

## 2014-11-02 ENCOUNTER — Encounter: Payer: 59 | Admitting: Adult Health

## 2014-12-15 ENCOUNTER — Encounter: Payer: Self-pay | Admitting: Adult Health

## 2014-12-15 ENCOUNTER — Ambulatory Visit (INDEPENDENT_AMBULATORY_CARE_PROVIDER_SITE_OTHER): Payer: 59 | Admitting: Adult Health

## 2014-12-15 VITALS — BP 138/80 | Temp 98.4°F | Ht 69.0 in | Wt 207.7 lb

## 2014-12-15 DIAGNOSIS — IMO0001 Reserved for inherently not codable concepts without codable children: Secondary | ICD-10-CM

## 2014-12-15 DIAGNOSIS — Z0000 Encounter for general adult medical examination without abnormal findings: Secondary | ICD-10-CM

## 2014-12-15 DIAGNOSIS — R030 Elevated blood-pressure reading, without diagnosis of hypertension: Secondary | ICD-10-CM

## 2014-12-15 LAB — BASIC METABOLIC PANEL
BUN: 18 mg/dL (ref 6–23)
CALCIUM: 9.6 mg/dL (ref 8.4–10.5)
CO2: 31 meq/L (ref 19–32)
Chloride: 105 mEq/L (ref 96–112)
Creatinine, Ser: 0.94 mg/dL (ref 0.40–1.50)
GFR: 85.8 mL/min (ref >60.00)
Glucose, Bld: 110 mg/dL — ABNORMAL HIGH (ref 70–99)
Potassium: 5.3 mEq/L — ABNORMAL HIGH (ref 3.5–5.1)
SODIUM: 143 meq/L (ref 135–145)

## 2014-12-15 LAB — CBC WITH DIFFERENTIAL/PLATELET
BASOS ABS: 0 10*3/uL (ref 0.0–0.1)
Basophils Relative: 0.8 % (ref 0.0–3.0)
Eosinophils Absolute: 0.2 10*3/uL (ref 0.0–0.7)
Eosinophils Relative: 3.6 % (ref 0.0–5.0)
HEMATOCRIT: 38.5 % — AB (ref 39.0–52.0)
HEMOGLOBIN: 13.1 g/dL (ref 13.0–17.0)
LYMPHS PCT: 24.8 % (ref 12.0–46.0)
Lymphs Abs: 1.3 10*3/uL (ref 0.7–4.0)
MCHC: 34 g/dL (ref 30.0–36.0)
MCV: 91.2 fl (ref 78.0–100.0)
MONOS PCT: 9.1 % (ref 3.0–12.0)
Monocytes Absolute: 0.5 10*3/uL (ref 0.1–1.0)
Neutro Abs: 3.2 10*3/uL (ref 1.4–7.7)
Neutrophils Relative %: 61.7 % (ref 43.0–77.0)
Platelets: 227 10*3/uL (ref 150.0–400.0)
RBC: 4.23 Mil/uL (ref 4.22–5.81)
RDW: 13.2 % (ref 11.5–15.5)
WBC: 5.2 10*3/uL (ref 4.0–10.5)

## 2014-12-15 LAB — POCT URINALYSIS DIPSTICK
BILIRUBIN UA: NEGATIVE
Blood, UA: NEGATIVE
Glucose, UA: NEGATIVE
KETONES UA: NEGATIVE
LEUKOCYTES UA: NEGATIVE
Nitrite, UA: NEGATIVE
Protein, UA: NEGATIVE
Spec Grav, UA: 1.025
Urobilinogen, UA: 0.2
pH, UA: 6

## 2014-12-15 LAB — HEPATIC FUNCTION PANEL
ALT: 24 U/L (ref 0–53)
AST: 21 U/L (ref 0–37)
Albumin: 4.5 g/dL (ref 3.5–5.2)
Alkaline Phosphatase: 61 U/L (ref 39–117)
BILIRUBIN DIRECT: 0.1 mg/dL (ref 0.0–0.3)
BILIRUBIN TOTAL: 0.5 mg/dL (ref 0.2–1.2)
Total Protein: 6.8 g/dL (ref 6.0–8.3)

## 2014-12-15 LAB — LIPID PANEL
CHOLESTEROL: 198 mg/dL (ref 0–200)
HDL: 52.6 mg/dL (ref >39.00)
LDL CALC: 131 mg/dL — AB (ref 0–99)
NonHDL: 145.59
TRIGLYCERIDES: 74 mg/dL (ref 0.0–149.0)
Total CHOL/HDL Ratio: 4
VLDL: 14.8 mg/dL (ref 0.0–40.0)

## 2014-12-15 LAB — TSH: TSH: 2.8 u[IU]/mL (ref 0.35–4.50)

## 2014-12-15 LAB — HEMOGLOBIN A1C: Hgb A1c MFr Bld: 5.8 % (ref 4.6–6.5)

## 2014-12-15 LAB — PSA: PSA: 3.43 ng/mL (ref 0.10–4.00)

## 2014-12-15 NOTE — Patient Instructions (Addendum)
It was great seeing you today!  Stay working on exercise. Getting your weight down will be a huge benefit to your health and will help drop your blood pressure.   Start monitoring your blood pressure and inform me if they are trending above 140/90.   Follow up with me in one year for your next physical or sooner if needed.    Health Maintenance A healthy lifestyle and preventative care can promote health and wellness.  Maintain regular health, dental, and eye exams.  Eat a healthy diet. Foods like vegetables, fruits, whole grains, low-fat dairy products, and lean protein foods contain the nutrients you need and are low in calories. Decrease your intake of foods high in solid fats, added sugars, and salt. Get information about a proper diet from your health care provider, if necessary.  Regular physical exercise is one of the most important things you can do for your health. Most adults should get at least 150 minutes of moderate-intensity exercise (any activity that increases your heart rate and causes you to sweat) each week. In addition, most adults need muscle-strengthening exercises on 2 or more days a week.   Maintain a healthy weight. The body mass index (BMI) is a screening tool to identify possible weight problems. It provides an estimate of body fat based on height and weight. Your health care provider can find your BMI and can help you achieve or maintain a healthy weight. For males 20 years and older:  A BMI below 18.5 is considered underweight.  A BMI of 18.5 to 24.9 is normal.  A BMI of 25 to 29.9 is considered overweight.  A BMI of 30 and above is considered obese.  Maintain normal blood lipids and cholesterol by exercising and minimizing your intake of saturated fat. Eat a balanced diet with plenty of fruits and vegetables. Blood tests for lipids and cholesterol should begin at age 31 and be repeated every 5 years. If your lipid or cholesterol levels are high, you are over  age 11, or you are at high risk for heart disease, you may need your cholesterol levels checked more frequently.Ongoing high lipid and cholesterol levels should be treated with medicines if diet and exercise are not working.  If you smoke, find out from your health care provider how to quit. If you do not use tobacco, do not start.  Lung cancer screening is recommended for adults aged 47-80 years who are at high risk for developing lung cancer because of a history of smoking. A yearly low-dose CT scan of the lungs is recommended for people who have at least a 30-pack-year history of smoking and are current smokers or have quit within the past 15 years. A pack year of smoking is smoking an average of 1 pack of cigarettes a day for 1 year (for example, a 30-pack-year history of smoking could mean smoking 1 pack a day for 30 years or 2 packs a day for 15 years). Yearly screening should continue until the smoker has stopped smoking for at least 15 years. Yearly screening should be stopped for people who develop a health problem that would prevent them from having lung cancer treatment.  If you choose to drink alcohol, do not have more than 2 drinks per day. One drink is considered to be 12 oz (360 mL) of beer, 5 oz (150 mL) of wine, or 1.5 oz (45 mL) of liquor.  Avoid the use of street drugs. Do not share needles with anyone. Ask for  help if you need support or instructions about stopping the use of drugs.  High blood pressure causes heart disease and increases the risk of stroke. Blood pressure should be checked at least every 1-2 years. Ongoing high blood pressure should be treated with medicines if weight loss and exercise are not effective.  If you are 74-60 years old, ask your health care provider if you should take aspirin to prevent heart disease.  Diabetes screening involves taking a blood sample to check your fasting blood sugar level. This should be done once every 3 years after age 40 if you  are at a normal weight and without risk factors for diabetes. Testing should be considered at a younger age or be carried out more frequently if you are overweight and have at least 1 risk factor for diabetes.  Colorectal cancer can be detected and often prevented. Most routine colorectal cancer screening begins at the age of 50 and continues through age 64. However, your health care provider may recommend screening at an earlier age if you have risk factors for colon cancer. On a yearly basis, your health care provider may provide home test kits to check for hidden blood in the stool. A small camera at the end of a tube may be used to directly examine the colon (sigmoidoscopy or colonoscopy) to detect the earliest forms of colorectal cancer. Talk to your health care provider about this at age 49 when routine screening begins. A direct exam of the colon should be repeated every 5-10 years through age 11, unless early forms of precancerous polyps or small growths are found.  People who are at an increased risk for hepatitis B should be screened for this virus. You are considered at high risk for hepatitis B if:  You were born in a country where hepatitis B occurs often. Talk with your health care provider about which countries are considered high risk.  Your parents were born in a high-risk country and you have not received a shot to protect against hepatitis B (hepatitis B vaccine).  You have HIV or AIDS.  You use needles to inject street drugs.  You live with, or have sex with, someone who has hepatitis B.  You are a man who has sex with other men (MSM).  You get hemodialysis treatment.  You take certain medicines for conditions like cancer, organ transplantation, and autoimmune conditions.  Hepatitis C blood testing is recommended for all people born from [birth year] through 1965 and any individual with known risk factors for hepatitis C.  Healthy men should no longer receive prostate-specific  antigen (PSA) blood tests as part of routine cancer screening. Talk to your health care provider about prostate cancer screening.  Testicular cancer screening is not recommended for adolescents or adult males who have no symptoms. Screening includes self-exam, a health care provider exam, and other screening tests. Consult with your health care provider about any symptoms you have or any concerns you have about testicular cancer.  Practice safe sex. Use condoms and avoid high-risk sexual practices to reduce the spread of sexually transmitted infections (STIs).  You should be screened for STIs, including gonorrhea and chlamydia if:  You are sexually active and are younger than 24 years.  You are older than 24 years, and your health care provider tells you that you are at risk for this type of infection.  Your sexual activity has changed since you were last screened, and you are at an increased risk for chlamydia or gonorrhea.  Ask your health care provider if you are at risk.  If you are at risk of being infected with HIV, it is recommended that you take a prescription medicine daily to prevent HIV infection. This is called pre-exposure prophylaxis (PrEP). You are considered at risk if:  You are a man who has sex with other men (MSM).  You are a heterosexual man who is sexually active with multiple partners.  You take drugs by injection.  You are sexually active with a partner who has HIV.  Talk with your health care provider about whether you are at high risk of being infected with HIV. If you choose to begin PrEP, you should first be tested for HIV. You should then be tested every 3 months for as long as you are taking PrEP.  Use sunscreen. Apply sunscreen liberally and repeatedly throughout the day. You should seek shade when your shadow is shorter than you. Protect yourself by wearing long sleeves, pants, a wide-brimmed hat, and sunglasses year round whenever you are outdoors.  Tell  your health care provider of new moles or changes in moles, especially if there is a change in shape or color. Also, tell your health care provider if a mole is larger than the size of a pencil eraser.  A one-time screening for abdominal aortic aneurysm (AAA) and surgical repair of large AAAs by ultrasound is recommended for men aged 20-75 years who are current or former smokers.  Stay current with your vaccines (immunizations). Document Released: 10/11/2007 Document Revised: 04/19/2013 Document Reviewed: 09/09/2010 [facility] Patient Information 2015 [place], [place]. This information is not intended to replace advice given to you by your health care provider. Make sure you discuss any questions you have with your health care provider.

## 2014-12-15 NOTE — Progress Notes (Signed)
   Subjective:    Patient ID: Cameron Aguilar, male    DOB: 06-01-1950, 64 y.o.   MRN: [id number]  HPI  64 year old male with  has a past medical history of Allergy and Hyperlipidemia. presents to the office today for his complete physical. It has been 2 years since his last physical. He endorses that he has not been dieting or exercising do to being busy at work. His weight is 207 lbs today, I would like him below 200 lbs.   His last colonoscopy was in 2011, he gets his eyes checked and goes to the dentist. His vaccinations are up to date.   Does not have any issues he would like to discuss today .     Review of Systems  Constitutional: Negative.   Eyes: Negative.   Respiratory: Negative.   Cardiovascular: Negative.   Gastrointestinal: Negative.   Endocrine: Negative.   Genitourinary: Negative.   Musculoskeletal: Negative.   Allergic/Immunologic: Negative.   Neurological: Negative.   Hematological: Negative.   Psychiatric/Behavioral: Negative.   All other systems reviewed and are negative.      Objective:   Physical Exam  Constitutional: He is oriented to person, place, and time. He appears well-developed and well-nourished. No distress.  obese  HENT:  Head: Normocephalic and atraumatic.  Right Ear: External ear normal.  Left Ear: External ear normal.  Mouth/Throat: Oropharynx is clear and moist. No oropharyngeal exudate.  Eyes: Conjunctivae and EOM are normal. Pupils are equal, round, and reactive to light. Right eye exhibits no discharge. Left eye exhibits no discharge. No scleral icterus.  Neck: Normal range of motion. Neck supple. No tracheal deviation present.  Cardiovascular: Normal rate, regular rhythm, normal heart sounds and intact distal pulses.  Exam reveals no gallop and no friction rub.   No murmur heard. No carotid bruit  Pulmonary/Chest: Effort normal and breath sounds normal. No respiratory distress. He has no wheezes. He has no rales. He exhibits no  tenderness.  Abdominal: Soft. Bowel sounds are normal.  Genitourinary:  Refused by patient.   Musculoskeletal: Normal range of motion. He exhibits no edema or tenderness.  Lymphadenopathy:    He has no cervical adenopathy.  Neurological: He is alert and oriented to person, place, and time. He has normal reflexes.  Skin: Skin is warm and dry. No rash noted. No erythema.  Psychiatric: He has a normal mood and affect. His behavior is normal. Judgment and thought content normal.  Nursing note and vitals reviewed.     Assessment & Plan:  1. Routine general medical examination at a health care facility - Stress importance of diet, exercise and weight loss.  - Basic metabolic panel - CBC with Differential - PSA - Hemoglobin A1c - Lipid panel - TSH - POCT urinalysis dipstick - EKG 12-Lead-Sinus  Rhythm  - frequent PAC s  # PACs = 2. Rate 60 - Hepatic function panel - Follow up in 1 year for CPE or sooner if needed  2. Elevated BP - Monitor BP at home. Inform provider if greater than 026/37 - Basic metabolic panel - CBC with Differential - Lipid panel - TSH - EKG 12-Lead

## 2014-12-15 NOTE — Progress Notes (Signed)
Pre visit review using our clinic review tool, if applicable. No additional management support is needed unless otherwise documented below in the visit note. 

## 2015-04-09 ENCOUNTER — Encounter: Payer: Self-pay | Admitting: Adult Health

## 2015-05-08 MED FILL — CIALIS 20 MG TABLET: 20 | 30 days supply | Qty: 6 | Fill #4

## 2015-05-28 ENCOUNTER — Telehealth: Payer: 59 | Admitting: Nurse Practitioner

## 2015-05-28 DIAGNOSIS — R05 Cough: Secondary | ICD-10-CM | POA: Diagnosis not present

## 2015-05-28 DIAGNOSIS — R059 Cough, unspecified: Secondary | ICD-10-CM

## 2015-05-28 NOTE — Progress Notes (Signed)
We are sorry that you are not feeling well.  Here is how we plan to help!  Based on what you have shared with me it looks like you have upper respiratory tract inflammation that has resulted in a significant cough.  Inflammation and infection in the upper respiratory tract is commonly called bronchitis and has four common causes:  Allergies, Viral Infections, Acid Reflux and Bacterial Infections.  Allergies, viruses and acid reflux are treated by controlling symptoms or eliminating the cause. An example might be a cough caused by taking certain blood pressure medications. You stop the cough by changing the medication. Another example might be a cough caused by acid reflux. Controlling the reflux helps control the cough.  Because you do not have a fever- i do not feel that you need an antibiotic- the cough may last several weeks so just treat with OTC cough meds  Based on your presentation I believe you most likely have A cough due to a virus.  This is called viral bronchitis and is best treated by rest, plenty of fluids and control of the cough.  You may use Ibuprofen or Tylenol as directed to help your symptoms.    In addition you may use A non-prescription cough medication called Robitussin DAC. Take 2 teaspoons every 8 hours or Delsym: take 2 teaspoons every 12 hours.    HOME CARE . Only take medications as instructed by your medical team. . Complete the entire course of an antibiotic. . Drink plenty of fluids and get plenty of rest. . Avoid close contacts especially the very young and the elderly . Cover your mouth if you cough or cough into your sleeve. . Always remember to wash your hands . A steam or ultrasonic humidifier can help congestion.    GET HELP RIGHT AWAY IF: . You develop worsening fever. . You become short of breath . You cough up blood. . Your symptoms persist after you have completed your treatment plan MAKE SURE YOU   Understand these instructions.  Will watch your  condition.  Will get help right away if you are not doing well or get worse.  Your e-visit answers were reviewed by a board certified advanced clinical practitioner to complete your personal care plan.  Depending on the condition, your plan could have included both over the counter or prescription medications. If there is a problem please reply  once you have received a response from your provider. Your safety is important to [place].  If you have drug allergies check your prescription carefully.    You can use MyChart to ask questions about today's visit, request a non-urgent call back, or ask for a work or school excuse for 24 hours related to this e-Visit. If it has been greater than 24 hours you will need to follow up with your provider, or enter a new e-Visit to address those concerns. You will get an e-mail in the next two days asking about your experience.  I hope that your e-visit has been valuable and will speed your recovery. Thank you for using e-visits.

## 2015-06-07 ENCOUNTER — Encounter: Payer: Self-pay | Admitting: Adult Health

## 2015-06-11 ENCOUNTER — Other Ambulatory Visit: Payer: Self-pay | Admitting: Adult Health

## 2015-06-11 DIAGNOSIS — G4733 Obstructive sleep apnea (adult) (pediatric): Secondary | ICD-10-CM

## 2015-08-27 ENCOUNTER — Ambulatory Visit (INDEPENDENT_AMBULATORY_CARE_PROVIDER_SITE_OTHER): Payer: 59 | Admitting: Family Medicine

## 2015-08-27 ENCOUNTER — Encounter: Payer: Self-pay | Admitting: Adult Health

## 2015-08-27 ENCOUNTER — Encounter: Payer: Self-pay | Admitting: Family Medicine

## 2015-08-27 VITALS — BP 140/80 | HR 70 | Temp 98.1°F | Wt 209.0 lb

## 2015-08-27 DIAGNOSIS — J069 Acute upper respiratory infection, unspecified: Secondary | ICD-10-CM

## 2015-08-27 DIAGNOSIS — J309 Allergic rhinitis, unspecified: Secondary | ICD-10-CM | POA: Diagnosis not present

## 2015-08-27 NOTE — Progress Notes (Signed)
Pre visit review using our clinic review tool, if applicable. No additional management support is needed unless otherwise documented below in the visit note. 

## 2015-08-27 NOTE — Progress Notes (Signed)
Subjective:    Patient ID: Cameron Aguilar, male    DOB: 12/08/1950, 65 y.o.   MRN: [id number]  HPI  Mr. Aveni is a 65 year old male who presents today for symptoms of sore throat, post nasal drip, congestion, and nonproductive cough. He denies sinus pressure/pain, tooth pain, ear pain, fever, chills, sweats, and N/V/D.  No history of asthma/bronchitis or recent antibiotic use. Recent sick contact exposure at work. Treatments at home include aleve and theraflu with moderate benefit. He reports that symptoms have improved today but he does have symptoms of allergic rhinits including watery eyes and rhinitis.   Review of Systems  Constitutional: Negative for fever, chills and fatigue.  HENT: Positive for postnasal drip, rhinorrhea, sneezing and sore throat. Negative for ear pain and sinus pressure.   Eyes: Negative for visual disturbance.       Watery eyes  Respiratory: Positive for cough. Negative for shortness of breath and wheezing.   Cardiovascular: Negative for chest pain and palpitations.  Gastrointestinal: Negative for nausea, vomiting and diarrhea.  Skin: Negative for rash.  Neurological: Negative for dizziness, numbness and headaches.   Past Medical History  Diagnosis Date  . Allergy   . Hyperlipidemia      Social History   Social History  . Marital Status: Married    Spouse Name: N/A  . Number of Children: N/A  . Years of Education: N/A   Occupational History  . Not on file.   Social History Main Topics  . Smoking status: Former Smoker    Quit date: 04/28/1978  . Smokeless tobacco: Not on file  . Alcohol Use: Yes     Comment: 2 glasses per week  . Drug Use: No  . Sexual Activity: Not on file   Other Topics Concern  . Not on file   Social History Narrative   Works for [employer] in [employer]   Married for 29 years   Two sons [place], [place]: two dogs, horses.        Past Surgical History  Procedure Laterality Date  . Knee surgery    .  Cystectomy      eyelid    Family History  Problem Relation Age of Onset  . Heart disease Paternal Grandfather 41    deceased in 79s from heart disease  . Cancer Father     stomach    No Known Allergies  Current Outpatient Prescriptions on File Prior to Visit  Medication Sig Dispense Refill  . atorvastatin (LIPITOR) 40 MG tablet Take 1 tablet (40 mg total) by mouth every morning. 90 tablet 3  . CIALIS 20 MG tablet TAKE 1 TABLET BY MOUTH EVERY OTHER DAY AS NEEDED 10 tablet 11   No current facility-administered medications on file prior to visit.    BP 140/80 mmHg  Pulse 70  Temp(Src) 98.1 F (36.7 C) (Oral)  Wt 209 lb (94.802 kg)       Objective:   Physical Exam  Constitutional: He is oriented to person, place, and time. He appears well-developed and well-nourished.  HENT:  Right Ear: Tympanic membrane normal.  Left Ear: Tympanic membrane normal.  Nose: Rhinorrhea present. Right sinus exhibits no maxillary sinus tenderness and no frontal sinus tenderness. Left sinus exhibits no maxillary sinus tenderness and no frontal sinus tenderness.  Mouth/Throat: Mucous membranes are normal. No oropharyngeal exudate or posterior oropharyngeal erythema.  Erythematous nasal membranes  Eyes: Pupils are equal, round, and reactive to light.  Neck: Neck supple.  Cardiovascular: Normal rate and regular rhythm.   Pulmonary/Chest: Effort normal and breath sounds normal. He has no wheezes. He has no rales.  Lymphadenopathy:    He has no cervical adenopathy.  Neurological: He is alert and oriented to person, place, and time.  Skin: Skin is warm and dry. No rash noted.  Psychiatric: He has a normal mood and affect. His behavior is normal. Judgment and thought content normal.        Assessment & Plan:  1. Allergic rhinitis, unspecified allergic rhinitis type Allegra, Claritin, or Zyrtec can be used with flonase for symptoms of allergic rhinitis.   2. Acute upper respiratory  infection Advised patient on supportive measures:  Get rest, drink plenty of fluids, and use tylenol or ibuprofen as needed for pain. Follow up if fever >101, if symptoms worsen or if symptoms are not improved in 3-4 days. Patient verbalizes understanding.   Delano Metz, FNP-C

## 2015-08-27 NOTE — Patient Instructions (Signed)
It was a pleasure seeing you today. Please use either Allegra, Claritin, or Zyrtec for symptoms and flonase can be added for symptoms of allergic rhinitis.  Mucinex DM can be used for cough as needed.  Upper Respiratory Infection, Adult Most upper respiratory infections (URIs) are a viral infection of the air passages leading to the lungs. A URI affects the nose, throat, and upper air passages. The most common type of URI is nasopharyngitis and is typically referred to as "the common cold." URIs run their course and usually go away on their own. Most of the time, a URI does not require medical attention, but sometimes a bacterial infection in the upper airways can follow a viral infection. This is called a secondary infection. Sinus and middle ear infections are common types of secondary upper respiratory infections. Bacterial pneumonia can also complicate a URI. A URI can worsen asthma and chronic obstructive pulmonary disease (COPD). Sometimes, these complications can require emergency medical care and may be life threatening.  CAUSES Almost all URIs are caused by viruses. A virus is a type of germ and can spread from one person to another.  RISKS FACTORS You may be at risk for a URI if:   You smoke.   You have chronic heart or lung disease.  You have a weakened defense (immune) system.   You are very young or very old.   You have nasal allergies or asthma.  You work in crowded or poorly ventilated areas.  You work in health care facilities or schools. SIGNS AND SYMPTOMS  Symptoms typically develop 2-3 days after you come in contact with a cold virus. Most viral URIs last 7-10 days. However, viral URIs from the influenza virus (flu virus) can last 14-18 days and are typically more severe. Symptoms may include:   Runny or stuffy (congested) nose.   Sneezing.   Cough.   Sore throat.   Headache.   Fatigue.   Fever.   Loss of appetite.   Pain in your forehead,  behind your eyes, and over your cheekbones (sinus pain).  Muscle aches.  DIAGNOSIS  Your health care provider may diagnose a URI by:  Physical exam.  Tests to check that your symptoms are not due to another condition such as:  Strep throat.  Sinusitis.  Pneumonia.  Asthma. TREATMENT  A URI goes away on its own with time. It cannot be cured with medicines, but medicines may be prescribed or recommended to relieve symptoms. Medicines may help:  Reduce your fever.  Reduce your cough.  Relieve nasal congestion. HOME CARE INSTRUCTIONS   Take medicines only as directed by your health care provider.   Gargle warm saltwater or take cough drops to comfort your throat as directed by your health care provider.  Use a warm mist humidifier or inhale steam from a shower to increase air moisture. This may make it easier to breathe.  Drink enough fluid to keep your urine clear or pale yellow.   Eat soups and other clear broths and maintain good nutrition.   Rest as needed.   Return to work when your temperature has returned to normal or as your health care provider advises. You may need to stay home longer to avoid infecting others. You can also use a face mask and careful hand washing to prevent spread of the virus.  Increase the usage of your inhaler if you have asthma.   Do not use any tobacco products, including cigarettes, chewing tobacco, or electronic cigarettes. If  you need help quitting, ask your health care provider. PREVENTION  The best way to protect yourself from getting a cold is to practice good hygiene.   Avoid oral or hand contact with people with cold symptoms.   Wash your hands often if contact occurs.  There is no clear evidence that vitamin C, vitamin E, echinacea, or exercise reduces the chance of developing a cold. However, it is always recommended to get plenty of rest, exercise, and practice good nutrition.  SEEK MEDICAL CARE IF:   You are getting  worse rather than better.   Your symptoms are not controlled by medicine.   You have chills.  You have worsening shortness of breath.  You have brown or red mucus.  You have yellow or brown nasal discharge.  You have pain in your face, especially when you bend forward.  You have a fever.  You have swollen neck glands.  You have pain while swallowing.  You have white areas in the back of your throat. SEEK IMMEDIATE MEDICAL CARE IF:   You have severe or persistent:  Headache.  Ear pain.  Sinus pain.  Chest pain.  You have chronic lung disease and any of the following:  Wheezing.  Prolonged cough.  Coughing up blood.  A change in your usual mucus.  You have a stiff neck.  You have changes in your:  Vision.  Hearing.  Thinking.  Mood. MAKE SURE YOU:   Understand these instructions.  Will watch your condition.  Will get help right away if you are not doing well or get worse.   This information is not intended to replace advice given to you by your health care provider. Make sure you discuss any questions you have with your health care provider.   Document Released: 10/08/2000 Document Revised: 08/29/2014 Document Reviewed: 07/20/2013 Elsevier Interactive Patient Education [employer].

## 2015-09-06 ENCOUNTER — Ambulatory Visit (INDEPENDENT_AMBULATORY_CARE_PROVIDER_SITE_OTHER): Payer: 59 | Admitting: Pulmonary Disease

## 2015-09-06 ENCOUNTER — Encounter: Payer: Self-pay | Admitting: Pulmonary Disease

## 2015-09-06 VITALS — BP 152/86 | HR 57 | Ht 69.0 in | Wt 207.0 lb

## 2015-09-06 DIAGNOSIS — E669 Obesity, unspecified: Secondary | ICD-10-CM | POA: Diagnosis not present

## 2015-09-06 DIAGNOSIS — J309 Allergic rhinitis, unspecified: Secondary | ICD-10-CM

## 2015-09-06 DIAGNOSIS — G4710 Hypersomnia, unspecified: Secondary | ICD-10-CM

## 2015-09-06 NOTE — Assessment & Plan Note (Signed)
Recent increase in snoring per wife. Denies abnormal behavior and sleep. Goes to bed at 10 -11 pm, quickly falls asleep. Wakes up once or twice during the night. No apparent reason. Wakes up 6:30, feeling refreshed. Occasional hypersomnia and napping over the weekend. Denies witnessed apneas. Occasional gasping and choking.  Patient is gained 10-15 pounds last year and a half.  ESS is 5. Neck circ 16 in   We discussed about the diagnosis of Obstructive Sleep Apnea (OSA) and implications of untreated OSA. We discussed about CPAP and BiPaP as possible treatment options.   Plan: 1. If ever, patient likely has mild obstructive sleep apnea. He is not too keen on starting CPAP therapy unless more significant. Plan for patient to loose 10 pounds over 3 months. Discussed with patient regarding nonsupine sleep. Patient currently uses an oral device which protrudes his tongue forward. He got this over-the-counter and it seems to be helping him. 2. In 3 months or so, we'll reassess regarding symptomatology. If more symptomatic or persistently symptomatic, plan for a home sleep study. We'll try auto CPAP 5-15 cm water. 3. Told patient also if issues become more problematic prior to 3 months for him to call the office and we will schedule a home sleep study. 4. We discussed good sleep hygiene.  5. Patient was advised not to engage in activities requiring concentration and/or vigilance if he/she is sleepy.  Patient was advised not to drive if he/she is sleepy.

## 2015-09-06 NOTE — Progress Notes (Signed)
Subjective:    Patient ID: Cameron Aguilar, male    DOB: 08-Dec-1950, 65 y.o.   MRN: [id number]  HPI   This is the case of Cameron Aguilar, 65 y.o. Male, who was referred by Dorothyann Peng  in consultation regarding possible OSA.   As you very well know, patient had a 5 PY smoking history, quit in 1979. He denies any history of asthma or COPD. Recent increase in snoring per wife. Denies abnormal behavior and sleep. Goes to bed at 10 -11 pm, quickly falls asleep. Wakes up once or twice during the night. No apparent reason. Wakes up 6:30, feeling refreshed. Occasional hypersomnia and napping over the weekend. Denies witnessed apneas. Occasional gasping and choking.  Patient is gained 10-15 pounds last year and a half.  Otherwise, patient is relatively healthy.      Review of Systems  Constitutional: Negative.  Negative for fever and unexpected weight change.       Gained 10-15 lbs x 2 yrs.   HENT: Positive for congestion and rhinorrhea. Negative for dental problem, ear pain, nosebleeds, postnasal drip, sinus pressure, sneezing, sore throat and trouble swallowing.   Eyes: Negative.  Negative for redness and itching.  Respiratory: Negative.  Negative for cough, chest tightness, shortness of breath and wheezing.   Cardiovascular: Negative.  Negative for palpitations and leg swelling.  Gastrointestinal: Negative.  Negative for nausea and vomiting.  Endocrine: Negative.   Genitourinary: Negative.  Negative for dysuria.  Musculoskeletal: Positive for arthralgias. Negative for joint swelling.  Skin: Negative.  Negative for rash.  Allergic/Immunologic: Positive for environmental allergies.  Neurological: Negative.  Negative for headaches.  Hematological: Negative.  Does not bruise/bleed easily.  Psychiatric/Behavioral: Negative.  Negative for dysphoric mood. The patient is not nervous/anxious.   All other systems reviewed and are negative.  Past Medical History  Diagnosis Date  . Allergy    . Hyperlipidemia    (-) CA, DVT, CAD  Family History  Problem Relation Age of Onset  . Heart disease Paternal Grandfather 5    deceased in 50s from heart disease  . Cancer Father     stomach     Past Surgical History  Procedure Laterality Date  . Knee surgery    . Cystectomy      eyelid    Social History   Social History  . Marital Status: Married    Spouse Name: N/A  . Number of Children: N/A  . Years of Education: N/A   Occupational History  . Not on file.   Social History Main Topics  . Smoking status: Former Smoker    Quit date: 04/28/1978  . Smokeless tobacco: Not on file  . Alcohol Use: Yes     Comment: 2 glasses per week  . Drug Use: No  . Sexual Activity: Not on file   Other Topics Concern  . Not on file   Social History Narrative   Works for [employer] in [employer]   Married for 53 years   Two sons [place], [place]: two dogs, horses.        Occasional wine at hs. No Known Allergies   Outpatient Prescriptions Prior to Visit  Medication Sig Dispense Refill  . atorvastatin (LIPITOR) 40 MG tablet Take 1 tablet (40 mg total) by mouth every morning. 90 tablet 3  . CIALIS 20 MG tablet TAKE 1 TABLET BY MOUTH EVERY OTHER DAY AS NEEDED 10 tablet 11   No facility-administered medications prior  to visit.   No orders of the defined types were placed in this encounter.          Objective:   Physical Exam   Vitals:  Filed Vitals:   09/06/15 0940  BP: 152/86  Pulse: 57  Height: 5\' 9"  (1.753 m)  Weight: 207 lb (93.895 kg)  SpO2: 97%    Constitutional/General:  Pleasant, well-nourished, well-developed, not in any distress,  Comfortably seating.  Well kempt  Body mass index is 30.55 kg/(m^2). Wt Readings from Last 3 Encounters:  09/06/15 207 lb (93.895 kg)  08/27/15 209 lb (94.802 kg)  12/15/14 207 lb 11.2 oz (94.212 kg)    Neck circumference: 16.5 in  HEENT: Pupils equal and reactive to light and accommodation. Anicteric  sclerae. Normal nasal mucosa.   No oral  lesions,  mouth clear,  oropharynx clear, no postnasal drip. (-) Oral thrush. No dental caries.  Airway - Mallampati class III  Neck: No masses. Midline trachea. No JVD, (-) LAD. (-) bruits appreciated.  Respiratory/Chest: Grossly normal chest. (-) deformity. (-) Accessory muscle use.  Symmetric expansion. (-) Tenderness on palpation.  Resonant on percussion.  Diminished BS on both lower lung zones. (-) wheezing, crackles, rhonchi (-) egophony  Cardiovascular: Regular rate and  rhythm, heart sounds normal, no murmur or gallops, no peripheral edema  Gastrointestinal:  Normal bowel sounds. Soft, non-tender. No hepatosplenomegaly.  (-) masses.   Musculoskeletal:  Normal muscle tone. Normal gait.   Extremities: Grossly normal. (-) clubbing, cyanosis.  (-) edema  Skin: (-) rash,lesions seen.   Neurological/Psychiatric : alert, oriented to time, place, person. Normal mood and affect           Assessment & Plan:  Hypersomnia Recent increase in snoring per wife. Denies abnormal behavior and sleep. Goes to bed at 10 -11 pm, quickly falls asleep. Wakes up once or twice during the night. No apparent reason. Wakes up 6:30, feeling refreshed. Occasional hypersomnia and napping over the weekend. Denies witnessed apneas. Occasional gasping and choking.  Patient is gained 10-15 pounds last year and a half.  ESS is 5. Neck circ 16 in   We discussed about the diagnosis of Obstructive Sleep Apnea (OSA) and implications of untreated OSA. We discussed about CPAP and BiPaP as possible treatment options.   Plan: 1. If ever, patient likely has mild obstructive sleep apnea. He is not too keen on starting CPAP therapy unless more significant. Plan for patient to loose 10 pounds over 3 months. Discussed with patient regarding nonsupine sleep. Patient currently uses an oral device which protrudes his tongue forward. He got this over-the-counter and it  seems to be helping him. 2. In 3 months or so, we'll reassess regarding symptomatology. If more symptomatic or persistently symptomatic, plan for a home sleep study. We'll try auto CPAP 5-15 cm water. 3. Told patient also if issues become more problematic prior to 3 months for him to call the office and we will schedule a home sleep study. 4. We discussed good sleep hygiene.  5. Patient was advised not to engage in activities requiring concentration and/or vigilance if he/she is sleepy.  Patient was advised not to drive if he/she is sleepy.     Allergic rhinitis Recent congestion 2/2 pollen. nasacort and allergy pill.  Will observe.   Obese Plan to lose 10 lbs in 110mos.     Thank you very much for letting me participate in this patient's care. Please do not hesitate to give me a call if you  have any questions or concerns regarding the treatment plan.   Patient will follow up with me in August    J. Shirl Harris, MD 09/06/2015   10:27 AM [facility] [place] Pager: [phone] Office: [phone], Fax: [phone]

## 2015-09-06 NOTE — Assessment & Plan Note (Signed)
Plan to lose 10 lbs in 2mos.

## 2015-09-06 NOTE — Assessment & Plan Note (Signed)
Recent congestion 2/2 pollen. nasacort and allergy pill.  Will observe.

## 2015-09-06 NOTE — Patient Instructions (Signed)
1. Plan to lose 10 lbs over 3 mos.  2. We will determine need for a home sleep study on f/u.   Return to clinic in 3 mos.

## 2015-09-14 MED FILL — CIALIS 20 MG TABLET: 20 | 30 days supply | Qty: 6 | Fill #5

## 2015-09-18 MED FILL — ATORVASTATIN 40 MG TABLET: 40 | 90 days supply | Qty: 90 | Fill #2

## 2015-10-25 ENCOUNTER — Other Ambulatory Visit: Payer: Self-pay | Admitting: Adult Health

## 2015-10-25 MED FILL — CIALIS 20 MG TABLET: 20 | 30 days supply | Qty: 6 | Fill #0

## 2015-10-25 NOTE — Telephone Encounter (Signed)
Ok to refill for one year  

## 2015-12-10 ENCOUNTER — Encounter: Payer: Self-pay | Admitting: Pulmonary Disease

## 2015-12-10 NOTE — Telephone Encounter (Signed)
Apt tmw.Wanted to give u progress 1.Weight down almost 10 lbs. at a sticky point but going to under 190 2.Exercising more. Big days are helping wife with horse show 3.Sleep better.Not experiencing the gasp for air issues

## 2015-12-11 ENCOUNTER — Ambulatory Visit (INDEPENDENT_AMBULATORY_CARE_PROVIDER_SITE_OTHER): Payer: 59 | Admitting: Pulmonary Disease

## 2015-12-11 ENCOUNTER — Encounter: Payer: Self-pay | Admitting: Pulmonary Disease

## 2015-12-11 DIAGNOSIS — G4710 Hypersomnia, unspecified: Secondary | ICD-10-CM | POA: Diagnosis not present

## 2015-12-11 NOTE — Patient Instructions (Signed)
It was a pleasure taking care of you today!   Pls give [place] a call back if you are having more symptoms of hypersomnia.    Return to clinic in as needed.

## 2015-12-11 NOTE — Progress Notes (Signed)
Subjective:    Patient ID: Cameron Aguilar, male    DOB: 06-06-1950, 65 y.o.   MRN: [id number]  HPI   This is the case of RICARD Aguilar, 65 y.o. Male, who was referred by Dorothyann Peng  in consultation regarding possible OSA.   As you very well know, patient had a 5 PY smoking history, quit in 1979. He denies any history of asthma or COPD. Recent increase in snoring per wife. Denies abnormal behavior and sleep. Goes to bed at 10 -11 pm, quickly falls asleep. Wakes up once or twice during the night. No apparent reason. Wakes up 6:30, feeling refreshed. Occasional hypersomnia and napping over the weekend. Denies witnessed apneas. Occasional gasping and choking.  Patient is gained 10-15 pounds last year and a half.  Otherwise, patient is relatively healthy.   ROV 12/11/15 Pt lost 10 lbs the last 3 mos. Has less gasping in the middle of the night. Denies hypersomnia. Less fatigue. Has more energy. Less snoring with weight loss.  No other medical issues since last seen.    Review of Systems  Constitutional: Negative.  Negative for fever and unexpected weight change.       Lost 10 lbs the last 3 mos  HENT: Positive for congestion and rhinorrhea. Negative for dental problem, ear pain, nosebleeds, postnasal drip, sinus pressure, sneezing, sore throat and trouble swallowing.   Eyes: Negative.  Negative for redness and itching.  Respiratory: Negative.  Negative for cough, chest tightness, shortness of breath and wheezing.   Cardiovascular: Negative.  Negative for palpitations and leg swelling.  Gastrointestinal: Negative.  Negative for nausea and vomiting.  Endocrine: Negative.   Genitourinary: Negative.  Negative for dysuria.  Musculoskeletal: Positive for arthralgias. Negative for joint swelling.  Skin: Negative.  Negative for rash.  Allergic/Immunologic: Positive for environmental allergies.  Neurological: Negative.  Negative for headaches.  Hematological: Negative.  Does not bruise/bleed  easily.  Psychiatric/Behavioral: Negative.  Negative for dysphoric mood. The patient is not nervous/anxious.   All other systems reviewed and are negative.      Objective:   Physical Exam   Vitals:  Vitals:   12/11/15 1623  BP: (!) 148/88  Pulse: (!) 53  SpO2: 97%  Weight: 199 lb (90.3 kg)  Height: 5\' 9"  (1.753 m)    Constitutional/General:  Pleasant, well-nourished, well-developed, not in any distress,  Comfortably seating.  Well kempt  Body mass index is 29.39 kg/m. Wt Readings from Last 3 Encounters:  12/11/15 199 lb (90.3 kg)  09/06/15 207 lb (93.9 kg)  08/27/15 209 lb (94.8 kg)    Neck circumference: 16.5 in  HEENT: Pupils equal and reactive to light and accommodation. Anicteric sclerae. Normal nasal mucosa.   No oral  lesions,  mouth clear,  oropharynx clear, no postnasal drip. (-) Oral thrush. No dental caries.  Airway - Mallampati class III  Neck: No masses. Midline trachea. No JVD, (-) LAD. (-) bruits appreciated.  Respiratory/Chest: Grossly normal chest. (-) deformity. (-) Accessory muscle use.  Symmetric expansion. (-) Tenderness on palpation.  Resonant on percussion.  Diminished BS on both lower lung zones. (-) wheezing, crackles, rhonchi (-) egophony  Cardiovascular: Regular rate and  rhythm, heart sounds normal, no murmur or gallops, no peripheral edema  Gastrointestinal:  Normal bowel sounds. Soft, non-tender. No hepatosplenomegaly.  (-) masses.   Musculoskeletal:  Normal muscle tone. Normal gait.   Extremities: Grossly normal. (-) clubbing, cyanosis.  (-) edema  Skin: (-) rash,lesions seen.   Neurological/Psychiatric :  alert, oriented to time, place, person. Normal mood and affect           Assessment & Plan:  Hypersomnia Pt was seen in 08/2015 for mild hypersomnia, fatigue, snoring. Hypersomnia did NOT affect fxnality. He had also gained weight through the years.  Since last seen, he has lost weight. Denies hypersomnia or  fatigue. Denies gasping/choking/apneas.  He is conservative. He wants to avoid sleep study or cpap.  At worst, he probably has mild OSA for which pt does not want Rx as he is not too symptomatic and he does not have significant co-morbidities.  Plan: 1. Observe for sx for now.  If he gains weight, or has hypersomnia or fatigue or other symptoms of OSA, I advised pt to call the office so we can schedule him for a home sleep test.  We will need to discuss with him cpap usage again. Likely, pt will like cpap if with moderate OSA.    Return to clinic in as needed.     Monica Becton, MD 12/12/2015   2:42 AM [facility] [place] Pager: [phone] Office: [phone], Fax: [phone]

## 2015-12-12 NOTE — Assessment & Plan Note (Addendum)
Pt was seen in 08/2015 for mild hypersomnia, fatigue, snoring. Hypersomnia did NOT affect fxnality. He had also gained weight through the years.  Since last seen, he has lost weight. Denies hypersomnia or fatigue. Denies gasping/choking/apneas.  He is conservative. He wants to avoid sleep study or cpap.  At worst, he probably has mild OSA for which pt does not want Rx as he is not too symptomatic and he does not have significant co-morbidities.  Plan: 1. Observe for sx for now.  If he gains weight, or has hypersomnia or fatigue or other symptoms of OSA, I advised pt to call the office so we can schedule him for a home sleep test.  We will need to discuss with him cpap usage again. Likely, pt will like cpap if with moderate OSA.

## 2016-01-15 ENCOUNTER — Other Ambulatory Visit: Payer: Self-pay | Admitting: Adult Health

## 2016-01-15 DIAGNOSIS — Z760 Encounter for issue of repeat prescription: Secondary | ICD-10-CM

## 2016-01-15 MED FILL — CIALIS 20 MG TABLET: 20 | 30 days supply | Qty: 6 | Fill #1

## 2016-01-15 MED FILL — ATORVASTATIN 40 MG TABLET: 40 | 90 days supply | Qty: 90 | Fill #0

## 2016-03-14 MED FILL — CIALIS 20 MG TABLET: 20 | 30 days supply | Qty: 6 | Fill #2

## 2016-06-19 MED FILL — CIALIS 20 MG TABLET: 20 | 30 days supply | Qty: 6 | Fill #3

## 2016-06-19 MED FILL — ATORVASTATIN 40 MG TABLET: 40 | 90 days supply | Qty: 90 | Fill #1

## 2016-07-15 ENCOUNTER — Encounter: Payer: Self-pay | Admitting: Adult Health

## 2016-07-21 ENCOUNTER — Encounter: Payer: Self-pay | Admitting: Adult Health

## 2016-07-23 ENCOUNTER — Telehealth: Payer: Self-pay | Admitting: Adult Health

## 2016-07-23 NOTE — Telephone Encounter (Signed)
Cameron Aguilar has some clinical questions concerning cialis

## 2016-07-30 NOTE — Telephone Encounter (Signed)
Cameron Aguilar is wondering if we received fax form for Prior Authorization - I have not received anything. I will route to Judson Roch to follow up.

## 2016-07-31 NOTE — Telephone Encounter (Signed)
A fax sheet came in, but patient has not tried Viagra according to his medications. I filled out the form & faxed it back, but it will most likely be denied since he hasn't had a trial of the Viagra.

## 2016-08-01 NOTE — Telephone Encounter (Signed)
I left message for patient to return phone call.   

## 2016-08-08 MED ORDER — SILDENAFIL CITRATE 50 MG PO TABS: 50.0000 mg | ORAL_TABLET | Freq: Every day | ORAL | 3 refills | Status: DC | PRN

## 2016-08-08 MED FILL — SILDENAFIL 50 MG TABLET: 50 | 30 days supply | Qty: 6 | Fill #0

## 2016-08-08 NOTE — Addendum Note (Signed)
Addended by: Sandria Bales B on: 08/08/2016 11:19 AM   Modules accepted: Orders

## 2016-08-08 NOTE — Telephone Encounter (Signed)
Patient returned phone call. He states he is okay with trying Viagra first. Per Tommi Rumps, ok to send in Viagra 50mg  #11 with 3 refills.  Rx sent into pharmacy.

## 2016-09-08 ENCOUNTER — Encounter: Payer: Self-pay | Admitting: Adult Health

## 2016-10-03 MED FILL — SILDENAFIL 50 MG TABLET: 50 | 30 days supply | Qty: 6 | Fill #1

## 2016-11-07 MED FILL — SILDENAFIL 50 MG TABLET: 50 | 30 days supply | Qty: 6 | Fill #2

## 2016-11-20 ENCOUNTER — Encounter: Payer: Self-pay | Admitting: Adult Health

## 2016-11-21 NOTE — Telephone Encounter (Signed)
Cameron Aguilar, Ok to switch medication and send to [facility]?

## 2016-11-25 ENCOUNTER — Encounter: Payer: Self-pay | Admitting: Adult Health

## 2016-11-25 MED ORDER — CIALIS 20 MG PO TABS: ORAL_TABLET | ORAL | 5 refills | Status: DC

## 2017-01-07 MED FILL — SILDENAFIL CITRATE 50 MG TA: 50 | 30 days supply | Qty: 6 | Fill #3

## 2017-01-16 ENCOUNTER — Encounter: Payer: Self-pay | Admitting: Adult Health

## 2017-02-16 ENCOUNTER — Other Ambulatory Visit: Payer: Self-pay | Admitting: Adult Health

## 2017-02-16 DIAGNOSIS — Z760 Encounter for issue of repeat prescription: Secondary | ICD-10-CM

## 2017-02-17 NOTE — Telephone Encounter (Signed)
Rx denied.  Pt needs cpx and fasting lab work.

## 2017-02-18 ENCOUNTER — Other Ambulatory Visit: Payer: Self-pay | Admitting: Adult Health

## 2017-02-18 DIAGNOSIS — Z760 Encounter for issue of repeat prescription: Secondary | ICD-10-CM

## 2017-02-19 NOTE — Telephone Encounter (Signed)
Denied.  Pt needs cpx and fasting lab work. 

## 2017-02-20 ENCOUNTER — Encounter: Payer: Self-pay | Admitting: Adult Health

## 2017-02-20 MED FILL — SILDENAFIL CITRATE 50 MG TA: 50 | 30 days supply | Qty: 6 | Fill #4

## 2017-03-18 ENCOUNTER — Ambulatory Visit (INDEPENDENT_AMBULATORY_CARE_PROVIDER_SITE_OTHER): Payer: 59 | Admitting: Adult Health

## 2017-03-18 ENCOUNTER — Encounter: Payer: Self-pay | Admitting: Adult Health

## 2017-03-18 VITALS — BP 136/84 | Temp 97.9°F | Ht 69.0 in | Wt 199.0 lb

## 2017-03-18 DIAGNOSIS — Z760 Encounter for issue of repeat prescription: Secondary | ICD-10-CM | POA: Diagnosis not present

## 2017-03-18 DIAGNOSIS — E782 Mixed hyperlipidemia: Secondary | ICD-10-CM

## 2017-03-18 DIAGNOSIS — Z1159 Encounter for screening for other viral diseases: Secondary | ICD-10-CM | POA: Diagnosis not present

## 2017-03-18 DIAGNOSIS — Z23 Encounter for immunization: Secondary | ICD-10-CM | POA: Diagnosis not present

## 2017-03-18 DIAGNOSIS — Z0000 Encounter for general adult medical examination without abnormal findings: Secondary | ICD-10-CM | POA: Diagnosis not present

## 2017-03-18 DIAGNOSIS — R030 Elevated blood-pressure reading, without diagnosis of hypertension: Secondary | ICD-10-CM

## 2017-03-18 MED ORDER — ATORVASTATIN CALCIUM 40 MG PO TABS: 40.0000 mg | ORAL_TABLET | Freq: Every morning | ORAL | 3 refills | Status: DC

## 2017-03-18 MED ORDER — SILDENAFIL CITRATE 100 MG PO TABS: 100.0000 mg | ORAL_TABLET | Freq: Every day | ORAL | 11 refills | Status: AC | PRN

## 2017-03-18 MED FILL — ATORVASTATIN 40 MG TABLET: 40 | 90 days supply | Qty: 90 | Fill #0

## 2017-03-18 MED FILL — SILDENAFIL CITRATE 100 MG T: 100 | 55 days supply | Qty: 11 | Fill #0

## 2017-03-18 NOTE — Patient Instructions (Signed)
It was great seeing you today   I will follow up with you regarding your blood work   Follow up with me in one year or sooner if needed  I hope you have a happy holiday season!   Health Maintenance, Male A healthy lifestyle and preventative care can promote health and wellness.  Maintain regular health, dental, and eye exams.  Eat a healthy diet. Foods like vegetables, fruits, whole grains, low-fat dairy products, and lean protein foods contain the nutrients you need and are low in calories. Decrease your intake of foods high in solid fats, added sugars, and salt. Get information about a proper diet from your health care provider, if necessary.  Regular physical exercise is one of the most important things you can do for your health. Most adults should get at least 150 minutes of moderate-intensity exercise (any activity that increases your heart rate and causes you to sweat) each week. In addition, most adults need muscle-strengthening exercises on 2 or more days a week.   Maintain a healthy weight. The body mass index (BMI) is a screening tool to identify possible weight problems. It provides an estimate of body fat based on height and weight. Your health care provider can find your BMI and can help you achieve or maintain a healthy weight. For males 20 years and older:  A BMI below 18.5 is considered underweight.  A BMI of 18.5 to 24.9 is normal.  A BMI of 25 to 29.9 is considered overweight.  A BMI of 30 and above is considered obese.  Maintain normal blood lipids and cholesterol by exercising and minimizing your intake of saturated fat. Eat a balanced diet with plenty of fruits and vegetables. Blood tests for lipids and cholesterol should begin at age 60 and be repeated every 5 years. If your lipid or cholesterol levels are high, you are over age 36, or you are at high risk for heart disease, you may need your cholesterol levels checked more frequently.Ongoing high lipid and  cholesterol levels should be treated with medicines if diet and exercise are not working.  If you smoke, find out from your health care provider how to quit. If you do not use tobacco, do not start.  Lung cancer screening is recommended for adults aged 63-80 years who are at high risk for developing lung cancer because of a history of smoking. A yearly low-dose CT scan of the lungs is recommended for people who have at least a 30-pack-year history of smoking and are current smokers or have quit within the past 15 years. A pack year of smoking is smoking an average of 1 pack of cigarettes a day for 1 year (for example, a 30-pack-year history of smoking could mean smoking 1 pack a day for 30 years or 2 packs a day for 15 years). Yearly screening should continue until the smoker has stopped smoking for at least 15 years. Yearly screening should be stopped for people who develop a health problem that would prevent them from having lung cancer treatment.  If you choose to drink alcohol, do not have more than 2 drinks per day. One drink is considered to be 12 oz (360 mL) of beer, 5 oz (150 mL) of wine, or 1.5 oz (45 mL) of liquor.  Avoid the use of street drugs. Do not share needles with anyone. Ask for help if you need support or instructions about stopping the use of drugs.  High blood pressure causes heart disease and increases the  risk of stroke. High blood pressure is more likely to develop in:  People who have blood pressure in the end of the normal range (100-139/85-89 mm Hg).  People who are overweight or obese.  People who are African American.  If you are 19-66 years of age, have your blood pressure checked every 3-5 years. If you are 77 years of age or older, have your blood pressure checked every year. You should have your blood pressure measured twice--once when you are at a hospital or clinic, and once when you are not at a hospital or clinic. Record the average of the two measurements. To  check your blood pressure when you are not at a hospital or clinic, you can use:  An automated blood pressure machine at a pharmacy.  A home blood pressure monitor.  If you are 19-46 years old, ask your health care provider if you should take aspirin to prevent heart disease.  Diabetes screening involves taking a blood sample to check your fasting blood sugar level. This should be done once every 3 years after age 70 if you are at a normal weight and without risk factors for diabetes. Testing should be considered at a younger age or be carried out more frequently if you are overweight and have at least 1 risk factor for diabetes.  Colorectal cancer can be detected and often prevented. Most routine colorectal cancer screening begins at the age of 69 and continues through age 19. However, your health care provider may recommend screening at an earlier age if you have risk factors for colon cancer. On a yearly basis, your health care provider may provide home test kits to check for hidden blood in the stool. A small camera at the end of a tube may be used to directly examine the colon (sigmoidoscopy or colonoscopy) to detect the earliest forms of colorectal cancer. Talk to your health care provider about this at age 26 when routine screening begins. A direct exam of the colon should be repeated every 5-10 years through age 29, unless early forms of precancerous polyps or small growths are found.  People who are at an increased risk for hepatitis B should be screened for this virus. You are considered at high risk for hepatitis B if:  You were born in a country where hepatitis B occurs often. Talk with your health care provider about which countries are considered high risk.  Your parents were born in a high-risk country and you have not received a shot to protect against hepatitis B (hepatitis B vaccine).  You have HIV or AIDS.  You use needles to inject street drugs.  You live with, or have sex  with, someone who has hepatitis B.  You are a man who has sex with other men (MSM).  You get hemodialysis treatment.  You take certain medicines for conditions like cancer, organ transplantation, and autoimmune conditions.  Hepatitis C blood testing is recommended for all people born from [birth year] through 1965 and any individual with known risk factors for hepatitis C.  Healthy men should no longer receive prostate-specific antigen (PSA) blood tests as part of routine cancer screening. Talk to your health care provider about prostate cancer screening.  Testicular cancer screening is not recommended for adolescents or adult males who have no symptoms. Screening includes self-exam, a health care provider exam, and other screening tests. Consult with your health care provider about any symptoms you have or any concerns you have about testicular cancer.  Practice safe  sex. Use condoms and avoid high-risk sexual practices to reduce the spread of sexually transmitted infections (STIs).  You should be screened for STIs, including gonorrhea and chlamydia if:  You are sexually active and are younger than 24 years.  You are older than 24 years, and your health care provider tells you that you are at risk for this type of infection.  Your sexual activity has changed since you were last screened, and you are at an increased risk for chlamydia or gonorrhea. Ask your health care provider if you are at risk.  If you are at risk of being infected with HIV, it is recommended that you take a prescription medicine daily to prevent HIV infection. This is called pre-exposure prophylaxis (PrEP). You are considered at risk if:  You are a man who has sex with other men (MSM).  You are a heterosexual man who is sexually active with multiple partners.  You take drugs by injection.  You are sexually active with a partner who has HIV.  Talk with your health care provider about whether you are at high risk of being  infected with HIV. If you choose to begin PrEP, you should first be tested for HIV. You should then be tested every 3 months for as long as you are taking PrEP.  Use sunscreen. Apply sunscreen liberally and repeatedly throughout the day. You should seek shade when your shadow is shorter than you. Protect yourself by wearing long sleeves, pants, a wide-brimmed hat, and sunglasses year round whenever you are outdoors.  Tell your health care provider of new moles or changes in moles, especially if there is a change in shape or color. Also, tell your health care provider if a mole is larger than the size of a pencil eraser.  A one-time screening for abdominal aortic aneurysm (AAA) and surgical repair of large AAAs by ultrasound is recommended for men aged 66-75 years who are current or former smokers.  Stay current with your vaccines (immunizations).   This information is not intended to replace advice given to you by your health care provider. Make sure you discuss any questions you have with your health care provider.   Document Released: 10/11/2007 Document Revised: 05/05/2014 Document Reviewed: 09/09/2010 Elsevier Interactive Patient Education [employer].

## 2017-03-18 NOTE — Progress Notes (Signed)
Subjective:    Patient ID: Cameron Aguilar, male    DOB: 02-14-1951, 66 y.o.   MRN: [id number]  HPI  Patient presents for yearly preventative medicine examination. He is a pleasant 66 year old male who  has a past medical history of Allergy and Hyperlipidemia.  He takes lipitor 40 mg for elevated cholesterol level  He has been checking his blood pressure at home on a routine basis. He reports consistent readings at home of 130-140/80.   All immunizations and health maintenance protocols were reviewed with the patient and needed orders were placed. He is due for Prevnar 13  Appropriate screening laboratory values were ordered for the patient including screening of hyperlipidemia, renal function and hepatic function. If indicated by BPH, a PSA was ordered.  Medication reconciliation,  past medical history, social history, problem list and allergies were reviewed in detail with the patient.   Goals were established with regard to weight loss, exercise, and  diet in compliance with medications. He has been exercising with his wife and has been eating healthier. He has been able to lose about 5 pounds over the last few months   End of life planning was discussed.  He is up to date on his colonoscopy. He participates in routine dental and vision care.   He has no acute complaints today   Review of Systems  Constitutional: Negative.   HENT: Negative.   Eyes: Negative.   Respiratory: Negative.   Cardiovascular: Negative.   Gastrointestinal: Negative.   Endocrine: Negative.   Genitourinary: Negative.   Musculoskeletal: Negative.   Skin: Negative.   Allergic/Immunologic: Negative.   Neurological: Negative.   Hematological: Negative.   Psychiatric/Behavioral: Negative.   All other systems reviewed and are negative.  Past Medical History:  Diagnosis Date  . Allergy   . Hyperlipidemia     Social History   Socioeconomic History  . Marital status: Married    Spouse name: Not on  file  . Number of children: Not on file  . Years of education: Not on file  . Highest education level: Not on file  Social Needs  . Financial resource strain: Not on file  . Food insecurity - worry: Not on file  . Food insecurity - inability: Not on file  . Transportation needs - medical: Not on file  . Transportation needs - non-medical: Not on file  Occupational History  . Not on file  Tobacco Use  . Smoking status: Former Smoker    Last attempt to quit: 04/28/1978    Years since quitting: 38.9  . Smokeless tobacco: Never Used  Substance and Sexual Activity  . Alcohol use: Yes    Comment: 2 glasses per week  . Drug use: No  . Sexual activity: Not on file  Other Topics Concern  . Not on file  Social History Narrative   Works for [employer] in [employer]   Married for 7 years   Two sons [place], [place]: two dogs, horses.     Past Surgical History:  Procedure Laterality Date  . CYSTECTOMY     eyelid  . KNEE SURGERY      Family History  Problem Relation Age of Onset  . Heart disease Paternal Grandfather 55       deceased in 24s from heart disease  . Cancer Father        stomach    No Known Allergies  Current Outpatient Medications on File Prior to Visit  Medication Sig Dispense Refill  . sildenafil (VIAGRA) 50 MG tablet Take 1 tablet (50 mg total) by mouth daily as needed for erectile dysfunction. 11 tablet 3  . atorvastatin (LIPITOR) 40 MG tablet TAKE 1 TABLET BY MOUTH EVERY MORNING. (Patient not taking: Reported on 03/18/2017) 90 tablet 3   No current facility-administered medications on file prior to visit.     BP 136/84 (BP Location: Left Arm)   Temp 97.9 F (36.6 C)   Ht 5\' 9"  (1.753 m)   Wt 199 lb (90.3 kg)   BMI 29.39 kg/m       Objective:   Physical Exam  Constitutional: He is oriented to person, place, and time. He appears well-developed and well-nourished. No distress.  HENT:  Head: Normocephalic and atraumatic.  Right Ear:  External ear normal.  Left Ear: External ear normal.  Nose: Nose normal.  Mouth/Throat: Oropharynx is clear and moist. No oropharyngeal exudate.  Eyes: Conjunctivae and EOM are normal. Pupils are equal, round, and reactive to light. Right eye exhibits no discharge. Left eye exhibits no discharge. No scleral icterus.  Neck: Neck supple. No JVD present. No tracheal deviation present. No thyromegaly present.  Cardiovascular: Normal rate, regular rhythm, normal heart sounds and intact distal pulses. Exam reveals no gallop and no friction rub.  No murmur heard. Pulmonary/Chest: Effort normal and breath sounds normal. No stridor. No respiratory distress. He has no wheezes. He has no rales. He exhibits no tenderness.  Abdominal: Soft. Bowel sounds are normal. He exhibits no distension and no mass. There is no tenderness. There is no rebound and no guarding.  Genitourinary: Prostate normal.  Musculoskeletal: Normal range of motion. He exhibits no edema, tenderness or deformity.  Lymphadenopathy:    He has no cervical adenopathy.  Neurological: He is alert and oriented to person, place, and time. He has normal reflexes. He displays normal reflexes. No cranial nerve deficit. He exhibits normal muscle tone. Coordination normal.  Skin: Skin is warm and dry. No rash noted. He is not diaphoretic. No erythema. No pallor.  Psychiatric: He has a normal mood and affect. His behavior is normal. Judgment and thought content normal.  Nursing note and vitals reviewed.     Assessment & Plan:  1. Routine general medical examination at a health care facility - Encouraged continued diet and exercise  - Follow up in one year or sooner if needed - Basic metabolic panel - CBC with Differential/Platelet - Hepatic function panel - Lipid panel - PSA - Hemoglobin A1c - Hep C Antibody  2. Mixed hyperlipidemia - Consider increasing statin  - Basic metabolic panel - CBC with Differential/Platelet - Hepatic function  panel - Lipid panel - PSA - Hemoglobin A1c  3. ELEVATED BLOOD PRESSURE WITHOUT DIAGNOSIS OF HYPERTENSION - Better controlled today. Will continue to monitor  - Basic metabolic panel - CBC with Differential/Platelet - Hepatic function panel - Lipid panel - PSA - Hemoglobin A1c  4. Need for hepatitis C screening test  - Hep C Antibody  5. Medication refill  - atorvastatin (LIPITOR) 40 MG tablet; Take 1 tablet (40 mg total) by mouth every morning.  Dispense: 90 tablet; Refill: 3 - sildenafil (VIAGRA) 100 MG tablet; Take 1 tablet (100 mg total) by mouth daily as needed for erectile dysfunction.  Dispense: 11 tablet; Refill: 11  6. Need for vaccination against Streptococcus pneumoniae  - Pneumococcal conjugate vaccine 13-valent  Dorothyann Peng, NP

## 2017-03-24 ENCOUNTER — Telehealth: Payer: Self-pay | Admitting: Family Medicine

## 2017-03-24 NOTE — Telephone Encounter (Signed)
-----   Message from Dorothyann Peng, NP sent at 03/24/2017  7:36 AM EST ----- He left before getting labs done. Can we call him and see if he plans to come get his physical labs done?   Cameron Aguilar  ----- Message ----- From: SYSTEM Sent: 03/23/2017  12:05 AM To: Dorothyann Peng, NP

## 2017-03-24 NOTE — Telephone Encounter (Signed)
Left a message for a return call on home #.  Cell # is not a working number.

## 2017-03-31 ENCOUNTER — Ambulatory Visit (INDEPENDENT_AMBULATORY_CARE_PROVIDER_SITE_OTHER): Payer: 59 | Admitting: Family Medicine

## 2017-03-31 ENCOUNTER — Encounter: Payer: Self-pay | Admitting: Family Medicine

## 2017-03-31 VITALS — BP 164/92 | HR 61 | Temp 98.5°F | Resp 16 | Wt 204.6 lb

## 2017-03-31 DIAGNOSIS — J018 Other acute sinusitis: Secondary | ICD-10-CM | POA: Diagnosis not present

## 2017-03-31 MED ORDER — METHYLPREDNISOLONE 4 MG PO TBPK: ORAL_TABLET | ORAL | 0 refills | Status: DC

## 2017-03-31 MED ORDER — AZITHROMYCIN 250 MG PO TABS
ORAL_TABLET | ORAL | 0 refills | Status: DC
Start: 2017-03-31 — End: 2019-05-04

## 2017-03-31 NOTE — Telephone Encounter (Signed)
Tried reaching the pt.

## 2017-03-31 NOTE — Progress Notes (Signed)
   Subjective:    Patient ID: Cameron Aguilar, male    DOB: Sep 08, 1950, 66 y.o.   MRN: [id number]  HPI Here for one week of sinus pressure, ear pressure, PND, and dizziness. No fever or cough. He is taking Dayquil. This started after he went snorkeling.    Review of Systems  Constitutional: Negative.   HENT: Positive for congestion, postnasal drip, sinus pressure and sinus pain. Negative for sore throat.   Eyes: Negative.   Respiratory: Negative.   Neurological: Positive for dizziness. Negative for headaches.       Objective:   Physical Exam  Constitutional: He appears well-developed and well-nourished.  HENT:  Right Ear: External ear normal.  Left Ear: External ear normal.  Nose: Nose normal.  Mouth/Throat: Oropharynx is clear and moist.  Eyes: Conjunctivae are normal.  Neck: No thyromegaly present.  Pulmonary/Chest: Effort normal and breath sounds normal. No respiratory distress. He has no wheezes. He has no rales.  Lymphadenopathy:    He has no cervical adenopathy.          Assessment & Plan:  Sinusitis with eustachian tube dysfunction. Treat with a Zpack and a Medrol dose pack.  Alysia Penna, MD

## 2017-04-06 ENCOUNTER — Encounter: Payer: Self-pay | Admitting: Adult Health

## 2017-04-07 ENCOUNTER — Encounter: Payer: Self-pay | Admitting: Family Medicine

## 2017-04-08 ENCOUNTER — Other Ambulatory Visit: Payer: Self-pay | Admitting: Adult Health

## 2017-04-08 ENCOUNTER — Encounter: Payer: Self-pay | Admitting: Family Medicine

## 2017-04-08 DIAGNOSIS — E782 Mixed hyperlipidemia: Secondary | ICD-10-CM

## 2017-04-08 DIAGNOSIS — Z1159 Encounter for screening for other viral diseases: Secondary | ICD-10-CM

## 2017-04-08 DIAGNOSIS — Z0000 Encounter for general adult medical examination without abnormal findings: Secondary | ICD-10-CM

## 2017-04-08 MED ORDER — DOXYCYCLINE HYCLATE 100 MG PO CAPS: 100.0000 mg | ORAL_CAPSULE | Freq: Two times a day (BID) | ORAL | 0 refills | Status: DC

## 2017-04-08 MED FILL — DOXYCYCLINE HYCLATE 100 MG: 100 | 7 days supply | Qty: 14 | Fill #0

## 2017-04-08 NOTE — Telephone Encounter (Signed)
Pt asked Tommi Rumps to get him on the schedule and then send him a Mychart message.  I placed pt on the schedule for 04/10/17 @ 8:45.  Sent a Pharmacist, community message for the pt to come fasting.

## 2017-04-10 ENCOUNTER — Other Ambulatory Visit: Payer: 59

## 2017-04-14 ENCOUNTER — Encounter: Payer: Self-pay | Admitting: Adult Health

## 2017-04-14 ENCOUNTER — Other Ambulatory Visit (INDEPENDENT_AMBULATORY_CARE_PROVIDER_SITE_OTHER): Payer: 59

## 2017-04-14 DIAGNOSIS — Z0000 Encounter for general adult medical examination without abnormal findings: Secondary | ICD-10-CM

## 2017-04-14 DIAGNOSIS — Z125 Encounter for screening for malignant neoplasm of prostate: Secondary | ICD-10-CM | POA: Diagnosis not present

## 2017-04-14 DIAGNOSIS — Z1159 Encounter for screening for other viral diseases: Secondary | ICD-10-CM | POA: Diagnosis not present

## 2017-04-14 DIAGNOSIS — E782 Mixed hyperlipidemia: Secondary | ICD-10-CM

## 2017-04-14 LAB — CBC WITH DIFFERENTIAL/PLATELET
BASOS PCT: 0.6 % (ref 0.0–3.0)
Basophils Absolute: 0 10*3/uL (ref 0.0–0.1)
EOS PCT: 3 % (ref 0.0–5.0)
Eosinophils Absolute: 0.2 10*3/uL (ref 0.0–0.7)
HEMATOCRIT: 41.4 % (ref 39.0–52.0)
HEMOGLOBIN: 13.8 g/dL (ref 13.0–17.0)
LYMPHS PCT: 23.5 % (ref 12.0–46.0)
Lymphs Abs: 1.3 10*3/uL (ref 0.7–4.0)
MCHC: 33.5 g/dL (ref 30.0–36.0)
MCV: 94.3 fl (ref 78.0–100.0)
Monocytes Absolute: 0.4 10*3/uL (ref 0.1–1.0)
Monocytes Relative: 7.9 % (ref 3.0–12.0)
Neutro Abs: 3.7 10*3/uL (ref 1.4–7.7)
Neutrophils Relative %: 65 % (ref 43.0–77.0)
Platelets: 212 10*3/uL (ref 150.0–400.0)
RBC: 4.39 Mil/uL (ref 4.22–5.81)
RDW: 13.7 % (ref 11.5–15.5)
WBC: 5.6 10*3/uL (ref 4.0–10.5)

## 2017-04-14 LAB — BASIC METABOLIC PANEL
BUN: 18 mg/dL (ref 6–23)
CALCIUM: 9.1 mg/dL (ref 8.4–10.5)
CO2: 32 mEq/L (ref 19–32)
Chloride: 103 mEq/L (ref 96–112)
Creatinine, Ser: 0.95 mg/dL (ref 0.40–1.50)
GFR: 84.15 mL/min (ref >60.00)
GLUCOSE: 106 mg/dL — AB (ref 70–99)
Potassium: 5 mEq/L (ref 3.5–5.1)
Sodium: 141 mEq/L (ref 135–145)

## 2017-04-14 LAB — LIPID PANEL
CHOL/HDL RATIO: 4
CHOLESTEROL: 200 mg/dL (ref 0–200)
HDL: 56.7 mg/dL (ref >39.00)
LDL CALC: 122 mg/dL — AB (ref 0–99)
NonHDL: 143.29
Triglycerides: 105 mg/dL (ref 0.0–149.0)
VLDL: 21 mg/dL (ref 0.0–40.0)

## 2017-04-14 LAB — HEPATIC FUNCTION PANEL
ALT: 18 U/L (ref 0–53)
AST: 18 U/L (ref 0–37)
Albumin: 4.3 g/dL (ref 3.5–5.2)
Alkaline Phosphatase: 54 U/L (ref 39–117)
BILIRUBIN DIRECT: 0.1 mg/dL (ref 0.0–0.3)
BILIRUBIN TOTAL: 0.7 mg/dL (ref 0.2–1.2)
Total Protein: 6.6 g/dL (ref 6.0–8.3)

## 2017-04-14 LAB — PSA: PSA: 3.88 ng/mL (ref 0.10–4.00)

## 2017-04-14 LAB — HEMOGLOBIN A1C: Hgb A1c MFr Bld: 5.9 % (ref 4.6–6.5)

## 2017-04-14 LAB — TSH: TSH: 2.19 u[IU]/mL (ref 0.35–4.50)

## 2017-04-15 LAB — HEPATITIS C ANTIBODY
Hepatitis C Ab: NONREACTIVE
SIGNAL TO CUT-OFF: 0.01 (ref <1.00)

## 2017-04-24 ENCOUNTER — Ambulatory Visit (INDEPENDENT_AMBULATORY_CARE_PROVIDER_SITE_OTHER): Payer: 59 | Admitting: Adult Health

## 2017-04-24 ENCOUNTER — Encounter: Payer: Self-pay | Admitting: Adult Health

## 2017-04-24 VITALS — BP 160/80 | Temp 98.1°F | Wt 204.0 lb

## 2017-04-24 DIAGNOSIS — R05 Cough: Secondary | ICD-10-CM

## 2017-04-24 DIAGNOSIS — R059 Cough, unspecified: Secondary | ICD-10-CM

## 2017-04-24 DIAGNOSIS — J029 Acute pharyngitis, unspecified: Secondary | ICD-10-CM

## 2017-04-24 LAB — POCT RAPID STREP A (OFFICE): Rapid Strep A Screen: NEGATIVE

## 2017-04-24 MED ORDER — BENZONATATE 200 MG PO CAPS: 200.0000 mg | ORAL_CAPSULE | Freq: Two times a day (BID) | ORAL | 1 refills | Status: DC | PRN

## 2017-04-24 MED FILL — BENZONATATE 200 MG CAPSULE: 200 | 10 days supply | Qty: 20 | Fill #0

## 2017-04-24 NOTE — Telephone Encounter (Signed)
Pt has made an appt to come in and see [facility]

## 2017-04-24 NOTE — Progress Notes (Signed)
Subjective:    Patient ID: Cameron Aguilar, male    DOB: 10-27-1950, 66 y.o.   MRN: [id number]  Sore Throat   This is a new problem. The current episode started in the past 7 days (3 days ). The problem has been gradually improving. There has been no fever. Associated symptoms include coughing, ear pain and trouble swallowing (resolved ). Pertinent negatives include no diarrhea. He has had no exposure to strep or mono. Treatments tried: Dyquil and Nyquil  The treatment provided mild relief.    Review of Systems  Constitutional: Negative.   HENT: Positive for ear pain, postnasal drip, sore throat and trouble swallowing (resolved ). Negative for sinus pressure and sinus pain.   Respiratory: Positive for cough.   Cardiovascular: Negative.   Gastrointestinal: Negative for diarrhea.   Past Medical History:  Diagnosis Date  . Allergy   . Hyperlipidemia     Social History   Socioeconomic History  . Marital status: Married    Spouse name: Not on file  . Number of children: Not on file  . Years of education: Not on file  . Highest education level: Not on file  Social Needs  . Financial resource strain: Not on file  . Food insecurity - worry: Not on file  . Food insecurity - inability: Not on file  . Transportation needs - medical: Not on file  . Transportation needs - non-medical: Not on file  Occupational History  . Not on file  Tobacco Use  . Smoking status: Former Smoker    Last attempt to quit: 04/28/1978    Years since quitting: 39.0  . Smokeless tobacco: Never Used  Substance and Sexual Activity  . Alcohol use: Yes    Comment: 2 glasses per week  . Drug use: No  . Sexual activity: Not on file  Other Topics Concern  . Not on file  Social History Narrative   Works for [employer] in [employer]   Married for 53 years   Two sons [place], [place]: two dogs, horses.     Past Surgical History:  Procedure Laterality Date  . CYSTECTOMY     eyelid  . KNEE SURGERY       Family History  Problem Relation Age of Onset  . Heart disease Paternal Grandfather 68       deceased in 4s from heart disease  . Cancer Father        stomach    No Known Allergies  Current Outpatient Medications on File Prior to Visit  Medication Sig Dispense Refill  . atorvastatin (LIPITOR) 40 MG tablet Take 1 tablet (40 mg total) by mouth every morning. 90 tablet 3  . azithromycin (ZITHROMAX) 250 MG tablet As directed 6 tablet 0  . doxycycline (VIBRAMYCIN) 100 MG capsule Take 1 capsule (100 mg total) by mouth 2 (two) times daily. 14 capsule 0  . Pseudoeph-Doxylamine-DM-APAP (NYQUIL PO) Take by mouth as needed.    . Pseudoephedrine-APAP-DM (DAYQUIL PO) Take by mouth as needed.    . sildenafil (VIAGRA) 100 MG tablet Take 1 tablet (100 mg total) by mouth daily as needed for erectile dysfunction. 11 tablet 11   No current facility-administered medications on file prior to visit.     BP (!) 160/80 (BP Location: Left Arm)   Temp 98.1 F (36.7 C) (Oral)   Wt 204 lb (92.5 kg)   BMI 30.13 kg/m      Objective:   Physical Exam  Constitutional: He is oriented to person, place, and time. He appears well-developed and well-nourished. No distress.  HENT:  Right Ear: Hearing, tympanic membrane, external ear and ear canal normal.  Left Ear: Hearing, tympanic membrane, external ear and ear canal normal.  Nose: No mucosal edema or rhinorrhea.  Mouth/Throat: Uvula is midline. Posterior oropharyngeal erythema (mild ) present. No oropharyngeal exudate or posterior oropharyngeal edema.  Neck: Neck supple.  Cardiovascular: Normal rate, regular rhythm, normal heart sounds and intact distal pulses. Exam reveals no gallop and no friction rub.  No murmur heard. Pulmonary/Chest: Effort normal and breath sounds normal. No respiratory distress. He has no wheezes. He has no rales. He exhibits no tenderness.  Lymphadenopathy:    He has no cervical adenopathy.  Neurological: He is alert and  oriented to person, place, and time.  Skin: Skin is warm and dry. No rash noted. He is not diaphoretic. No erythema. No pallor.  Psychiatric: He has a normal mood and affect. His behavior is normal. Judgment and thought content normal.  Nursing note and vitals reviewed.     Assessment & Plan:  1. Sore throat - POC Rapid Strep A- negative   2. Cough - benzonatate (TESSALON) 200 MG capsule; Take 1 capsule (200 mg total) by mouth 2 (two) times daily as needed for cough.  Dispense: 20 capsule; Refill: 1 - Follow up as needed  Dorothyann Peng, NP

## 2017-05-04 ENCOUNTER — Encounter: Payer: Self-pay | Admitting: Adult Health

## 2017-05-06 DIAGNOSIS — J343 Hypertrophy of nasal turbinates: Secondary | ICD-10-CM | POA: Diagnosis not present

## 2017-05-06 DIAGNOSIS — J310 Chronic rhinitis: Secondary | ICD-10-CM | POA: Diagnosis not present

## 2017-05-06 DIAGNOSIS — J342 Deviated nasal septum: Secondary | ICD-10-CM | POA: Diagnosis not present

## 2017-05-06 DIAGNOSIS — R040 Epistaxis: Secondary | ICD-10-CM | POA: Diagnosis not present

## 2017-05-06 MED FILL — FLUTICASONE PROP 50 MCG SPR: 50 | 30 days supply | Qty: 16 | Fill #0

## 2017-05-21 MED FILL — SILDENAFIL CITRATE 100 MG T: 100 | 55 days supply | Qty: 11 | Fill #1

## 2017-07-03 MED FILL — AMOXICILLIN-POT CLAVULANATE: 875-125 | 10 days supply | Qty: 20 | Fill #0

## 2017-07-15 ENCOUNTER — Other Ambulatory Visit (HOSPITAL_COMMUNITY): Payer: Self-pay | Admitting: Otolaryngology

## 2017-07-15 DIAGNOSIS — J342 Deviated nasal septum: Secondary | ICD-10-CM

## 2017-07-30 ENCOUNTER — Ambulatory Visit (HOSPITAL_COMMUNITY)
Admission: RE | Admit: 2017-07-30 | Discharge: 2017-07-30 | Disposition: A | Discharge status: Home / Self Care | Payer: 59 | Source: Ambulatory Visit | Attending: Otolaryngology | Admitting: Otolaryngology

## 2017-07-30 DIAGNOSIS — K001 Supernumerary teeth: Secondary | ICD-10-CM | POA: Diagnosis not present

## 2017-07-30 DIAGNOSIS — K011 Impacted teeth: Secondary | ICD-10-CM | POA: Diagnosis not present

## 2017-07-30 DIAGNOSIS — J342 Deviated nasal septum: Secondary | ICD-10-CM | POA: Insufficient documentation

## 2017-07-30 DIAGNOSIS — R0981 Nasal congestion: Secondary | ICD-10-CM | POA: Diagnosis not present

## 2017-08-03 DIAGNOSIS — J343 Hypertrophy of nasal turbinates: Secondary | ICD-10-CM | POA: Diagnosis not present

## 2017-08-03 DIAGNOSIS — R0683 Snoring: Secondary | ICD-10-CM | POA: Diagnosis not present

## 2017-08-03 DIAGNOSIS — J310 Chronic rhinitis: Secondary | ICD-10-CM | POA: Diagnosis not present

## 2017-08-03 DIAGNOSIS — R040 Epistaxis: Secondary | ICD-10-CM | POA: Diagnosis not present

## 2017-08-03 DIAGNOSIS — J342 Deviated nasal septum: Secondary | ICD-10-CM | POA: Diagnosis not present

## 2017-08-07 MED FILL — ATORVASTATIN 40 MG TABLET: 40 | 90 days supply | Qty: 90 | Fill #1

## 2017-08-07 MED FILL — SILDENAFIL CITRATE 100 MG T: 100 | 55 days supply | Qty: 11 | Fill #2

## 2017-10-02 MED FILL — SILDENAFIL CITRATE 100 MG T: 100 | 55 days supply | Qty: 11 | Fill #3

## 2017-10-08 MED FILL — AMOX-CLAV 875-125 MG TABLET: 875-125 | 7 days supply | Qty: 14 | Fill #0

## 2017-12-10 MED FILL — SILDENAFIL CITRATE 100 MG T: 100 | 55 days supply | Qty: 11 | Fill #4

## 2017-12-10 MED FILL — ATORVASTATIN 40 MG TABLET: 40 | 90 days supply | Qty: 90 | Fill #2

## 2018-01-14 ENCOUNTER — Encounter: Payer: Self-pay | Admitting: Adult Health

## 2018-01-14 NOTE — Telephone Encounter (Signed)
Please see other MyChart encounter from 01/14/18.

## 2018-01-14 NOTE — Telephone Encounter (Signed)
Please advise, should this be scheduled as a physical or acute visit?  Pt sent a sperate message stating he is traveling all next week but could [facility] [address] for blood work if this would help you.

## 2018-01-22 ENCOUNTER — Ambulatory Visit (INDEPENDENT_AMBULATORY_CARE_PROVIDER_SITE_OTHER): Payer: 59 | Admitting: Adult Health

## 2018-01-22 ENCOUNTER — Encounter: Payer: Self-pay | Admitting: Adult Health

## 2018-01-22 VITALS — BP 142/80 | HR 59 | Temp 98.0°F | Wt 202.6 lb

## 2018-01-22 DIAGNOSIS — I10 Essential (primary) hypertension: Secondary | ICD-10-CM | POA: Diagnosis not present

## 2018-01-22 DIAGNOSIS — N529 Male erectile dysfunction, unspecified: Secondary | ICD-10-CM

## 2018-01-22 LAB — BASIC METABOLIC PANEL
BUN: 17 mg/dL (ref 6–23)
CALCIUM: 9.2 mg/dL (ref 8.4–10.5)
CO2: 30 mEq/L (ref 19–32)
Chloride: 102 mEq/L (ref 96–112)
Creatinine, Ser: 0.97 mg/dL (ref 0.40–1.50)
GFR: 81.95 mL/min (ref >60.00)
Glucose, Bld: 113 mg/dL — ABNORMAL HIGH (ref 70–99)
Potassium: 4.7 mEq/L (ref 3.5–5.1)
SODIUM: 139 meq/L (ref 135–145)

## 2018-01-22 LAB — LIPID PANEL
CHOL/HDL RATIO: 4
Cholesterol: 202 mg/dL — ABNORMAL HIGH (ref 0–200)
HDL: 50.8 mg/dL (ref >39.00)
LDL Cholesterol: 126 mg/dL — ABNORMAL HIGH (ref 0–99)
NonHDL: 151.27
Triglycerides: 125 mg/dL (ref 0.0–149.0)
VLDL: 25 mg/dL (ref 0.0–40.0)

## 2018-01-22 NOTE — Progress Notes (Signed)
Subjective:    Patient ID: Cameron Aguilar, male    DOB: 17-Dec-1950, 67 y.o.   MRN: [id number]  HPI   67 year old male who  has a past medical history of Allergy and Hyperlipidemia. He presents to the office today for follow up regarding elevated blood pressure readings. In the past when he checked at home his blood pressure readings were in the 130-140/80. Over the last month or so he has noticed his blood pressure readings going up in the 150/90's   He has started working out over the last week and plans to continue with this.   He also reports that his ED has worsened. He used to be able to take 1/2 pill of Viagra and now is having to take a full pill without the results he would like.    BP Readings from Last 3 Encounters:  01/22/18 (!) 142/80  04/24/17 (!) 160/80  03/31/17 (!) 164/92   Wt Readings from Last 3 Encounters:  01/22/18 202 lb 9.6 oz (91.9 kg)  04/24/17 204 lb (92.5 kg)  03/31/17 204 lb 9.6 oz (92.8 kg)    Review of Systems See HPI   Past Medical History:  Diagnosis Date  . Allergy   . Hyperlipidemia     Social History   Socioeconomic History  . Marital status: Married    Spouse name: Not on file  . Number of children: Not on file  . Years of education: Not on file  . Highest education level: Not on file  Occupational History  . Not on file  Social Needs  . Financial resource strain: Not on file  . Food insecurity:    Worry: Not on file    Inability: Not on file  . Transportation needs:    Medical: Not on file    Non-medical: Not on file  Tobacco Use  . Smoking status: Former Smoker    Last attempt to quit: 04/28/1978    Years since quitting: 39.7  . Smokeless tobacco: Never Used  Substance and Sexual Activity  . Alcohol use: Yes    Comment: 2 glasses per week  . Drug use: No  . Sexual activity: Not on file  Lifestyle  . Physical activity:    Days per week: Not on file    Minutes per session: Not on file  . Stress: Not on file    Relationships  . Social connections:    Talks on phone: Not on file    Gets together: Not on file    Attends religious service: Not on file    Active member of club or organization: Not on file    Attends meetings of clubs or organizations: Not on file    Relationship status: Not on file  . Intimate partner violence:    Fear of current or ex partner: Not on file    Emotionally abused: Not on file    Physically abused: Not on file    Forced sexual activity: Not on file  Other Topics Concern  . Not on file  Social History Narrative   Works for [employer] in [employer]   Married for 63 years   Two sons [place], [place]: two dogs, horses.     Past Surgical History:  Procedure Laterality Date  . CYSTECTOMY     eyelid  . KNEE SURGERY      Family History  Problem Relation Age of Onset  . Cancer Father  stomach  . Heart disease Paternal Grandfather 44       deceased in 54s from heart disease    No Known Allergies  Current Outpatient Medications on File Prior to Visit  Medication Sig Dispense Refill  . atorvastatin (LIPITOR) 40 MG tablet Take 1 tablet (40 mg total) by mouth every morning. 90 tablet 3  . azithromycin (ZITHROMAX) 250 MG tablet As directed 6 tablet 0  . benzonatate (TESSALON) 200 MG capsule Take 1 capsule (200 mg total) by mouth 2 (two) times daily as needed for cough. 20 capsule 1  . doxycycline (VIBRAMYCIN) 100 MG capsule Take 1 capsule (100 mg total) by mouth 2 (two) times daily. 14 capsule 0  . Pseudoeph-Doxylamine-DM-APAP (NYQUIL PO) Take by mouth as needed.    . Pseudoephedrine-APAP-DM (DAYQUIL PO) Take by mouth as needed.    . sildenafil (VIAGRA) 100 MG tablet Take 1 tablet (100 mg total) by mouth daily as needed for erectile dysfunction. 11 tablet 11   No current facility-administered medications on file prior to visit.     BP (!) 142/80 (BP Location: Left Arm, Patient Position: Sitting, Cuff Size: Normal)   Pulse (!) 59   Temp 98  F (36.7 C) (Oral)   Wt 202 lb 9.6 oz (91.9 kg)   SpO2 96%   BMI 29.92 kg/m       Objective:   Physical Exam  Constitutional: He is oriented to person, place, and time. He appears well-developed and well-nourished. No distress.  Overweight    Cardiovascular: Normal rate, regular rhythm, normal heart sounds and intact distal pulses.  Pulmonary/Chest: Effort normal and breath sounds normal.  Neurological: He is alert and oriented to person, place, and time.  Skin: He is not diaphoretic.  Psychiatric: He has a normal mood and affect. His behavior is normal. Judgment and thought content normal.  Nursing note and vitals reviewed.     Assessment & Plan:  1. Essential hypertension - We discussed various options for BP control. It was recommended to start a BP medication, he would like to continue to work on diet and exercise for the next month and then reevaluate. He does want to check kidney function and lipid panel today  - Basic Metabolic Panel - Lipid panel  2. Erectile dysfunction, unspecified erectile dysfunction type - Will check T level but expressed that this may be due to sedentary lifestyle. Encouraged to continue to exercise and eat healthy  - Testosterone Total,Free,Bio, Males  Dorothyann Peng, NP

## 2018-01-25 LAB — TESTOSTERONE TOTAL,FREE,BIO, MALES
Albumin: 4.4 g/dL (ref 3.6–5.1)
SEX HORMONE BINDING: 31 nmol/L (ref 22–77)
TESTOSTERONE FREE: 52.5 pg/mL (ref 46.0–224.0)
Testosterone, Bioavailable: 105.7 ng/dL — ABNORMAL LOW (ref >=110.0)
Testosterone: 381 ng/dL (ref 250–827)

## 2018-01-25 MED FILL — SILDENAFIL CITRATE 100 MG T: 100 | 55 days supply | Qty: 11 | Fill #5

## 2018-01-26 ENCOUNTER — Encounter: Payer: Self-pay | Admitting: Adult Health

## 2018-01-26 DIAGNOSIS — Z1283 Encounter for screening for malignant neoplasm of skin: Secondary | ICD-10-CM

## 2018-01-26 DIAGNOSIS — R7989 Other specified abnormal findings of blood chemistry: Secondary | ICD-10-CM

## 2018-02-18 ENCOUNTER — Encounter: Payer: Self-pay | Admitting: Adult Health

## 2018-03-03 DIAGNOSIS — N5201 Erectile dysfunction due to arterial insufficiency: Secondary | ICD-10-CM | POA: Diagnosis not present

## 2018-03-03 MED FILL — TADALAFIL 20 MG TABS: 20 | 30 days supply | Qty: 6 | Fill #0

## 2018-04-14 MED FILL — TADALAFIL 20 MG TABS: 20 | 30 days supply | Qty: 6 | Fill #1

## 2018-05-28 ENCOUNTER — Other Ambulatory Visit: Payer: Self-pay | Admitting: Adult Health

## 2018-05-28 DIAGNOSIS — Z760 Encounter for issue of repeat prescription: Secondary | ICD-10-CM

## 2018-05-28 MED FILL — TADALAFIL 20 MG TABS: 20 | 30 days supply | Qty: 6 | Fill #2

## 2018-05-31 MED FILL — ATORVASTATIN 40 MG TABLET: 40 | 90 days supply | Qty: 90 | Fill #0

## 2018-07-06 DIAGNOSIS — N5201 Erectile dysfunction due to arterial insufficiency: Secondary | ICD-10-CM | POA: Diagnosis not present

## 2018-07-07 DIAGNOSIS — L821 Other seborrheic keratosis: Secondary | ICD-10-CM | POA: Diagnosis not present

## 2018-07-07 DIAGNOSIS — L814 Other melanin hyperpigmentation: Secondary | ICD-10-CM | POA: Diagnosis not present

## 2018-07-07 DIAGNOSIS — Z85828 Personal history of other malignant neoplasm of skin: Secondary | ICD-10-CM | POA: Diagnosis not present

## 2018-07-07 DIAGNOSIS — D225 Melanocytic nevi of trunk: Secondary | ICD-10-CM | POA: Diagnosis not present

## 2018-07-07 DIAGNOSIS — L720 Epidermal cyst: Secondary | ICD-10-CM | POA: Diagnosis not present

## 2018-07-07 DIAGNOSIS — D1801 Hemangioma of skin and subcutaneous tissue: Secondary | ICD-10-CM | POA: Diagnosis not present

## 2018-07-13 DIAGNOSIS — N5201 Erectile dysfunction due to arterial insufficiency: Secondary | ICD-10-CM | POA: Diagnosis not present

## 2018-10-07 ENCOUNTER — Encounter: Payer: Self-pay | Admitting: Adult Health

## 2018-10-08 ENCOUNTER — Telehealth: Payer: Self-pay | Admitting: General Practice

## 2018-10-08 DIAGNOSIS — Z20822 Contact with and (suspected) exposure to covid-19: Secondary | ICD-10-CM

## 2018-10-08 NOTE — Telephone Encounter (Signed)
-----   Message from Hulda Humphrey, [place] sent at 10/08/2018  2:32 PM EDT ----- This pt is requesting Covid testing.  Wife having surgery and has been tested.

## 2018-10-08 NOTE — Telephone Encounter (Signed)
Patient returned call and he has been scheduled for testing next week at his request.

## 2018-10-08 NOTE — Telephone Encounter (Signed)
lvm at mobile number on file (preferred #) to return call to schedule covid testing.

## 2018-10-08 NOTE — Addendum Note (Signed)
Addended by: Valli Glance F on: 10/08/2018 03:36 PM   Modules accepted: Orders

## 2018-10-15 ENCOUNTER — Other Ambulatory Visit: Payer: 59

## 2018-10-15 DIAGNOSIS — Z20822 Contact with and (suspected) exposure to covid-19: Secondary | ICD-10-CM

## 2018-10-15 DIAGNOSIS — R6889 Other general symptoms and signs: Secondary | ICD-10-CM | POA: Diagnosis not present

## 2018-10-21 LAB — NOVEL CORONAVIRUS, NAA: SARS-CoV-2, NAA: NOT DETECTED

## 2019-01-12 MED FILL — TADALAFIL 20 MG TABS: 20 | 30 days supply | Qty: 6 | Fill #3

## 2019-01-12 MED FILL — ATORVASTATIN 40 MG TABLET: 40 | 90 days supply | Qty: 90 | Fill #1

## 2019-03-10 ENCOUNTER — Encounter: Payer: Self-pay | Admitting: Adult Health

## 2019-03-11 ENCOUNTER — Telehealth (INDEPENDENT_AMBULATORY_CARE_PROVIDER_SITE_OTHER): Payer: 59 | Admitting: Adult Health

## 2019-03-11 ENCOUNTER — Other Ambulatory Visit: Payer: Self-pay

## 2019-03-11 ENCOUNTER — Encounter: Payer: Self-pay | Admitting: Adult Health

## 2019-03-11 DIAGNOSIS — M940 Chondrocostal junction syndrome [Tietze]: Secondary | ICD-10-CM | POA: Diagnosis not present

## 2019-03-11 NOTE — Progress Notes (Signed)
Virtual Visit via Video Note  I connected with Renato Gails on 03/11/19 at  3:30 PM EST by a video enabled telemedicine application and verified that I am speaking with the correct person using two identifiers.  Location patient: home Location provider:work or home office Persons participating in the virtual visit: patient, provider  I discussed the limitations of evaluation and management by telemedicine and the availability of in person appointments. The patient expressed understanding and agreed to proceed.   HPI: 68 year old male who is being evaluated today for right lower rib pain.  Reports that the pain started about 3 weeks ago.  Pain is described as sharp and only happens when he takes a deep breath.  Denies shortness of breath, chest pain, difficulty breathing, cough, fevers, or chills.  He reports that he has been playing golf and he is building a home in [facility]. He believes that he may have pulled a muscle.  He had not taken any over-the-counter pain medication or anti-inflammatories until earlier today at which time he took an Advil.  Today his pain has improved dramatically.   ROS: See pertinent positives and negatives per HPI.  Past Medical History:  Diagnosis Date  . Allergy   . Hyperlipidemia     Past Surgical History:  Procedure Laterality Date  . CYSTECTOMY     eyelid  . KNEE SURGERY      Family History  Problem Relation Age of Onset  . Cancer Father        stomach  . Heart disease Paternal Grandfather 55       deceased in 53s from heart disease     Current Outpatient Medications:  .  atorvastatin (LIPITOR) 40 MG tablet, TAKE 1 TABLET (40 MG TOTAL) BY MOUTH EVERY MORNING., Disp: 90 tablet, Rfl: 3 .  azithromycin (ZITHROMAX) 250 MG tablet, As directed, Disp: 6 tablet, Rfl: 0 .  benzonatate (TESSALON) 200 MG capsule, Take 1 capsule (200 mg total) by mouth 2 (two) times daily as needed for cough., Disp: 20 capsule, Rfl: 1 .  doxycycline (VIBRAMYCIN)  100 MG capsule, Take 1 capsule (100 mg total) by mouth 2 (two) times daily., Disp: 14 capsule, Rfl: 0 .  Pseudoeph-Doxylamine-DM-APAP (NYQUIL PO), Take by mouth as needed., Disp: , Rfl:  .  Pseudoephedrine-APAP-DM (DAYQUIL PO), Take by mouth as needed., Disp: , Rfl:  .  sildenafil (VIAGRA) 100 MG tablet, Take 1 tablet (100 mg total) by mouth daily as needed for erectile dysfunction., Disp: 11 tablet, Rfl: 11  EXAM:  VITALS per patient if applicable:  GENERAL: alert, oriented, appears well and in no acute distress  HEENT: atraumatic, conjunttiva clear, no obvious abnormalities on inspection of external nose and ears  NECK: normal movements of the head and neck  LUNGS: on inspection no signs of respiratory distress, breathing rate appears normal, no obvious gross SOB, gasping or wheezing  CV: no obvious cyanosis  MS: moves all visible extremities without noticeable abnormality  PSYCH/NEURO: pleasant and cooperative, no obvious depression or anxiety, speech and thought processing grossly intact  ASSESSMENT AND PLAN:  Discussed the following assessment and plan:  1. Costochondritis, acute -Likely from physical strain.  No concerns for PE or other cardiopulmonary issues.  Advised to take Motrin every 6-8 hours for the next 3 to 5 days.  Follow-up if not resolved    I discussed the assessment and treatment plan with the patient. The patient was provided an opportunity to ask questions and all were answered. The patient agreed  with the plan and demonstrated an understanding of the instructions.   The patient was advised to call back or seek an in-person evaluation if the symptoms worsen or if the condition fails to improve as anticipated.   Dorothyann Peng, NP

## 2019-03-26 DIAGNOSIS — Z20828 Contact with and (suspected) exposure to other viral communicable diseases: Secondary | ICD-10-CM | POA: Diagnosis not present

## 2019-03-26 IMAGING — CT CT MAXILLOFACIAL W/O CM
3 series · 14 of 47 positions shown, 16 images · non-contrast
Comparison: None.

CLINICAL DATA: 66-year-old male with nasal congestion since
[REDACTED]. Recently stopped antibiotics. Reports continued ear
congestion.

EXAM:
CT MAXILLOFACIAL WITHOUT CONTRAST
TECHNIQUE: Multidetector CT images of the paranasal sinuses were obtained using
the standard protocol without intravenous contrast.

[Series 2: standard · axial · 0.41mm/px · z∈[-139,-31]mm · 8 of 126 slices shown, 10 images]
[im 9/126  brain]
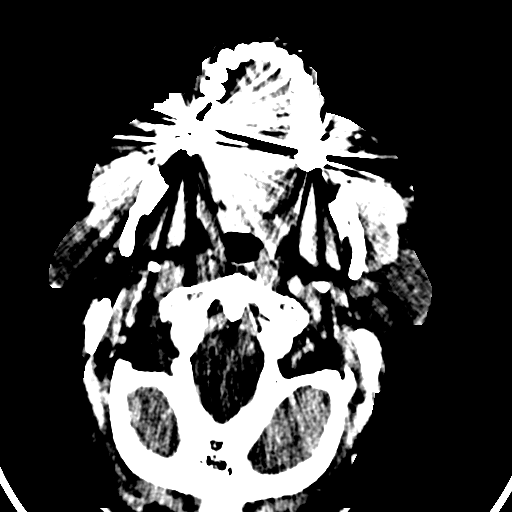
[im 9/126  bone]
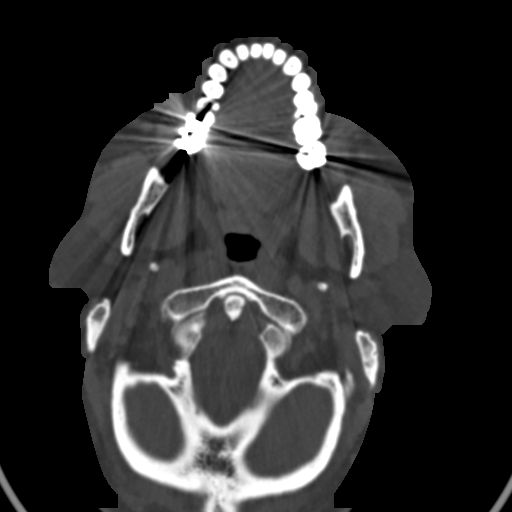
[im 26/126  bone]
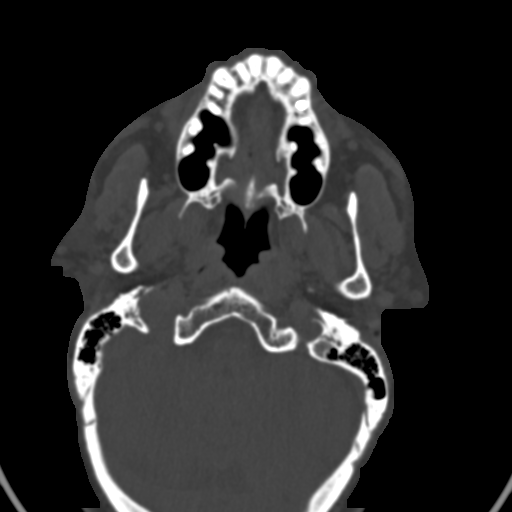
[im 39/126  bone]
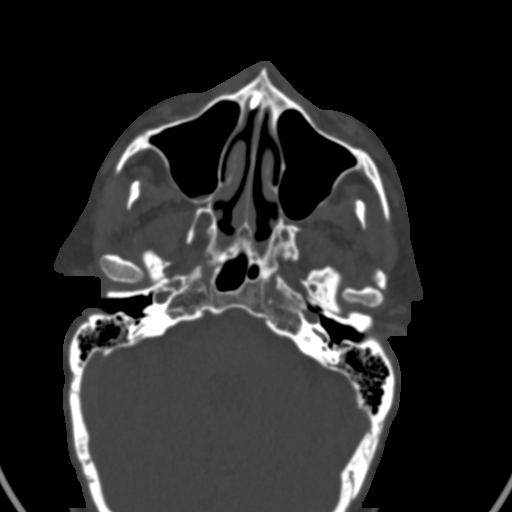
[im 57/126  bone]
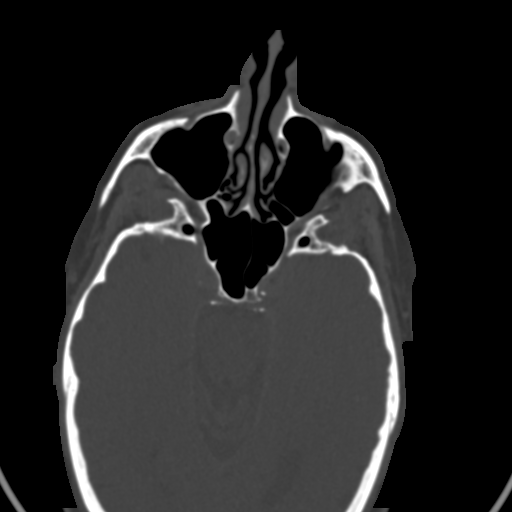
[im 69/126  brain]
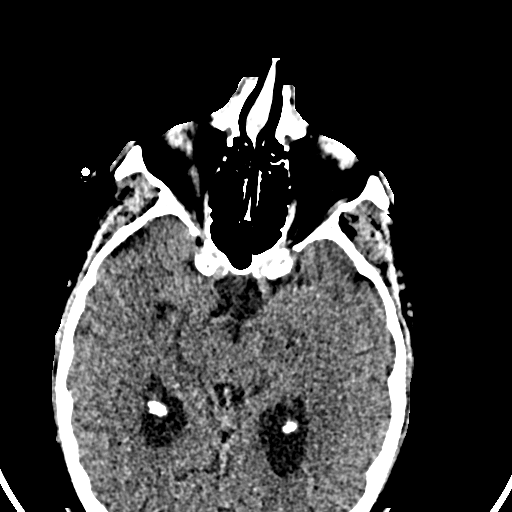
[im 69/126  bone]
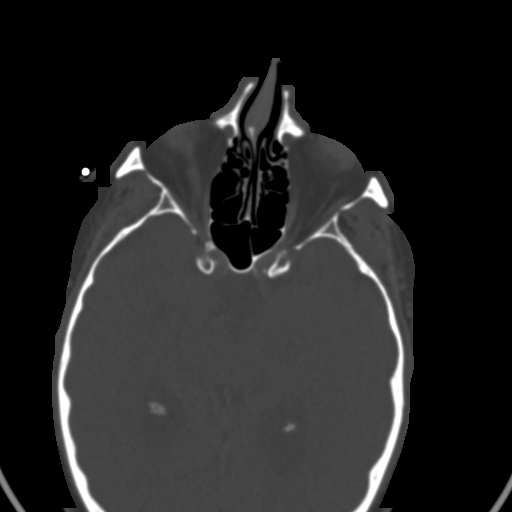
[im 87/126  bone]
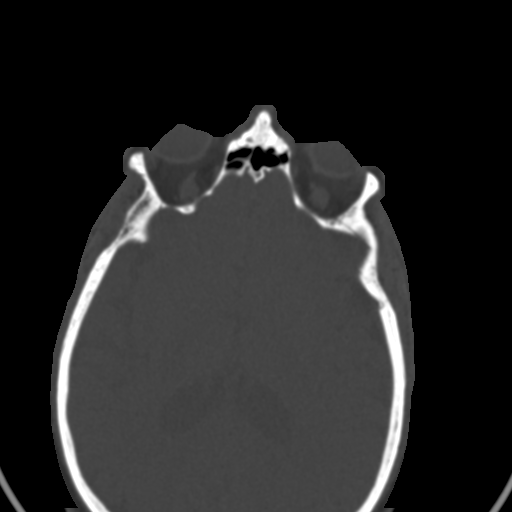
[im 100/126  bone]
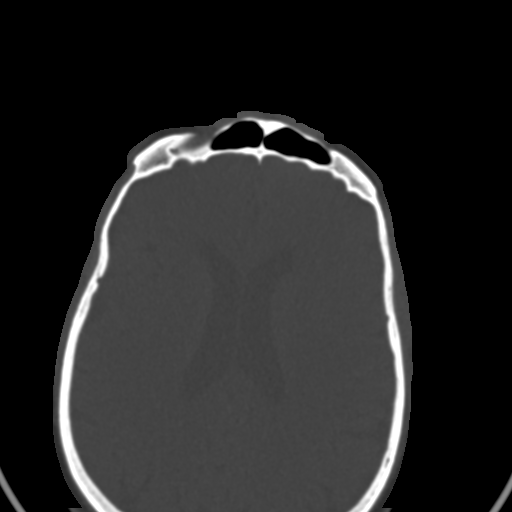
[im 117/126  bone]
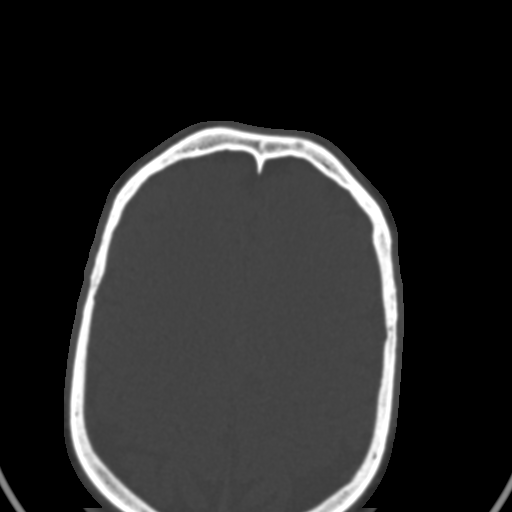

[Series 4: coronal · coronal · 0.28mm/px · 3 of 104 slices shown]
[im 35/104  bone]
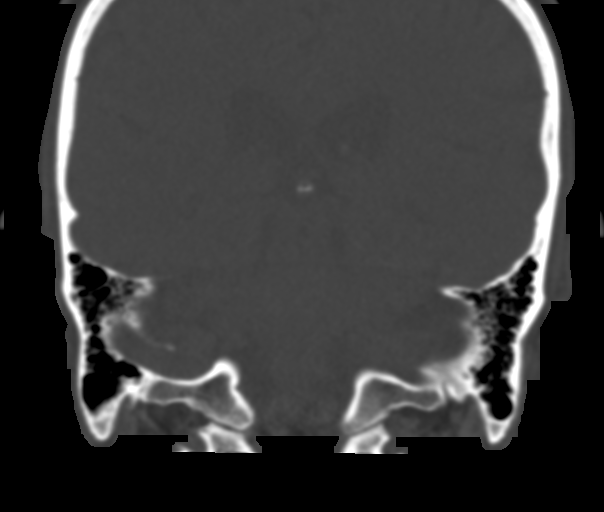
[im 46/104  bone]
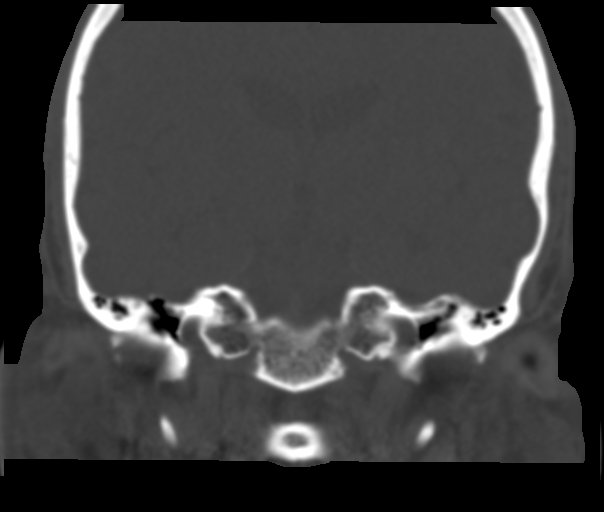
[im 58/104  bone]
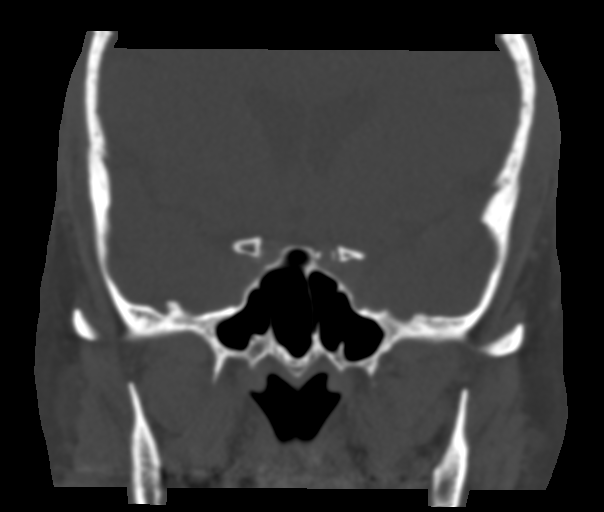

[Series 5: sagittal · sagittal · 0.26mm/px · 3 of 83 slices shown]
[im 28/83  bone]
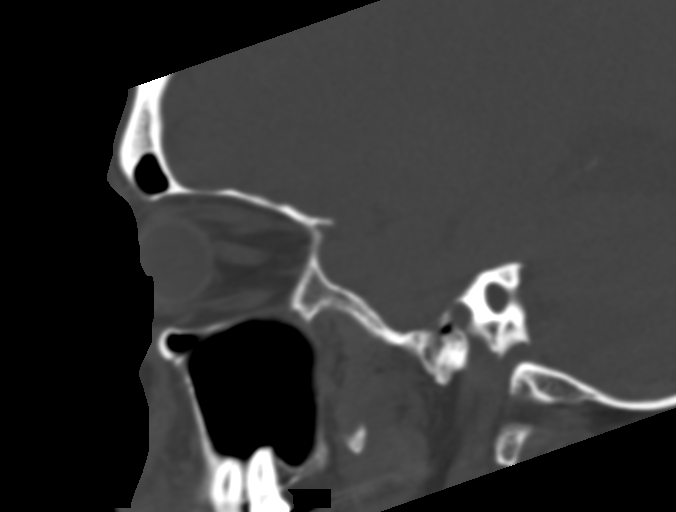
[im 42/83  bone]
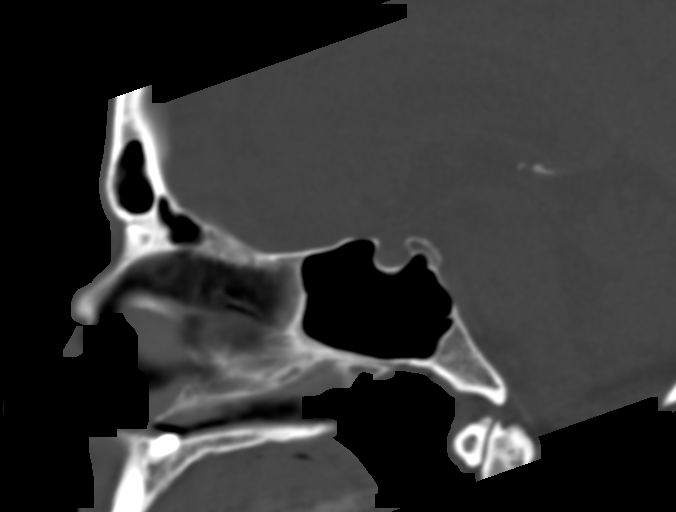
[im 55/83  bone]
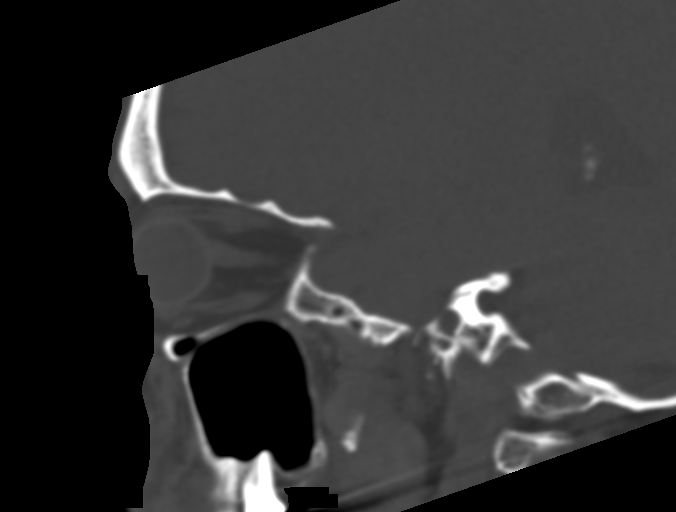

[14 of 47 positions shown; findings below may reference images not displayed]

FINDINGS: Paranasal sinuses:

Frontal: Normally aerated. Patent frontal sinus drainage pathways.

Ethmoid: Normally aerated.

Maxillary: Hyperplastic. Left maxillary sinus is clear. Normally
aerated right maxillary sinus aside from a small polypoid area of
mucosal thickening or mucous retention cyst along the posterior
right maxillary sinus on series 3, image 44, and trace right
alveolar recess mucosal thickening.

Sphenoid: Hyperplastic. Normally aerated. Patent sphenoethmoidal
recesses.

Right ostiomeatal unit: Patent (coronal images 27 and 28).

Left ostiomeatal unit: Anteriorly oriented, more so than the right
side. The left OMC is best seen on axial images series 3, images 61
through 70 and appears clear.

Nasal passages: Supernumerary impacted maxillary tooth in the
midline, slightly extending into the anterior inferior right nasal
cavity (series 3, images 36-39 and coronal image 21).

Superimposed nasal septum is intact. There is mild rightward nasal
septal deviation and spurring. Left middle concha bullosa which is
pneumatized. There is minimal to mild increased nasal cavity mucosal
thickening. Both olfactory recesses are pneumatized.

Anatomy:

Hyperplastic sphenoid sinuses without clinoid process pneumatization
(coronal image 45).

Bilateral anterior ethmoidal artery position suspected on coronal
image 30.

Keros type 2 olfactory fossa.

Other: Calcified atherosclerosis at the skull base. Negative for age
visible noncontrast brain parenchyma.

Visualized orbits and scalp soft tissues are within normal limits.
Negative visible noncontrast deep soft tissue spaces of the face.

Bilateral tympanic cavities are clear. Bilateral mastoid air cells
are clear.

No acute osseous abnormality identified. Supernumerary impacted
maxillary tooth in the midline, slightly extending into the anterior
inferior nasal cavity as stated above.
IMPRESSION: 1. No significant paranasal sinus disease. Patent sinus drainage
pathways.
2. Mild if any nasal cavity mucosal thickening. Mild rightward nasal
septal deviation. Supernumerary impacted maxillary tooth in the
midline which slightly extends into the anterior inferior right
nasal cavity.
3. Tympanic cavities and mastoids are clear.

## 2019-04-06 ENCOUNTER — Encounter: Payer: Self-pay | Admitting: Family Medicine

## 2019-04-29 ENCOUNTER — Encounter: Payer: Self-pay | Admitting: Adult Health

## 2019-05-02 ENCOUNTER — Encounter: Payer: Self-pay | Admitting: Adult Health

## 2019-05-04 ENCOUNTER — Ambulatory Visit: Payer: 59 | Admitting: Adult Health

## 2019-05-04 ENCOUNTER — Other Ambulatory Visit: Payer: Self-pay

## 2019-05-04 ENCOUNTER — Ambulatory Visit (INDEPENDENT_AMBULATORY_CARE_PROVIDER_SITE_OTHER): Payer: 59

## 2019-05-04 ENCOUNTER — Encounter: Payer: Self-pay | Admitting: Adult Health

## 2019-05-04 VITALS — BP 160/90 | HR 56 | Temp 97.7°F | Ht 69.0 in | Wt 192.0 lb

## 2019-05-04 DIAGNOSIS — R109 Unspecified abdominal pain: Secondary | ICD-10-CM | POA: Diagnosis not present

## 2019-05-04 MED ORDER — METHYLPREDNISOLONE 4 MG PO TBPK: ORAL_TABLET | ORAL | 0 refills | Status: DC

## 2019-05-04 MED ORDER — CYCLOBENZAPRINE HCL 10 MG PO TABS: 10.0000 mg | ORAL_TABLET | Freq: Every day | ORAL | 0 refills | Status: AC

## 2019-05-04 NOTE — Progress Notes (Signed)
Subjective:    Patient ID: Cameron Aguilar, male    DOB: 10-20-1950, 69 y.o.   MRN: [id number]  HPI 69 year old male who  has a past medical history of Allergy and Hyperlipidemia.  He was originally seen on 03/11/2019 via telemedicine visit for right lower rib pain.  Pain started approximately 3 weeks prior.  At this time his pain was described as sharp and only happen when he took a deep breath.  He denied shortness of breath, chest pain, difficulty breathing, cough, fevers, or chills.  This time he had been playing golf and building a new home in [facility] and believe that he pulled a muscle.  When he took over-the-counter pain medication his pain had improved significantly.  Today he presents for follow-up and reports that he continues to have intermittent "sharp pain" along his right flank.  Some days he has no pain at all and other days pain is worse when he takes a deep breath and moves a certain way.  At times it will cause him to "double over in pain".  He denies pain after eating, nausea, vomiting, diarrhea, or radiating pain between the shoulder blades.  He has no UTI-like symptoms.  Pain feels as though it is coming from "underneath the ribs".    Review of Systems See HPI   Past Medical History:  Diagnosis Date  . Allergy   . Hyperlipidemia     Social History   Socioeconomic History  . Marital status: Married    Spouse name: Not on file  . Number of children: Not on file  . Years of education: Not on file  . Highest education level: Not on file  Occupational History  . Not on file  Tobacco Use  . Smoking status: Former Smoker    Quit date: 04/28/1978    Years since quitting: 41.0  . Smokeless tobacco: Never Used  Substance and Sexual Activity  . Alcohol use: Yes    Comment: 2 glasses per week  . Drug use: No  . Sexual activity: Not on file  Other Topics Concern  . Not on file  Social History Narrative   Works for [employer] in [employer]   Married  for 36 years   Two sons [place], [place]: two dogs, horses.    Social Determinants of Health   Financial Resource Strain:   . Difficulty of Paying Living Expenses: Not on file  Food Insecurity:   . Worried About Charity fundraiser in the Last Year: Not on file  . Ran Out of Food in the Last Year: Not on file  Transportation Needs:   . Lack of Transportation (Medical): Not on file  . Lack of Transportation (Non-Medical): Not on file  Physical Activity:   . Days of Exercise per Week: Not on file  . Minutes of Exercise per Session: Not on file  Stress:   . Feeling of Stress : Not on file  Social Connections:   . Frequency of Communication with Friends and Family: Not on file  . Frequency of Social Gatherings with Friends and Family: Not on file  . Attends Religious Services: Not on file  . Active Member of Clubs or Organizations: Not on file  . Attends Archivist Meetings: Not on file  . Marital Status: Not on file  Intimate Partner Violence:   . Fear of Current or Ex-Partner: Not on file  . Emotionally Abused: Not on file  .  Physically Abused: Not on file  . Sexually Abused: Not on file    Past Surgical History:  Procedure Laterality Date  . CYSTECTOMY     eyelid  . KNEE SURGERY      Family History  Problem Relation Age of Onset  . Cancer Father        stomach  . Heart disease Paternal Grandfather 91       deceased in 46s from heart disease    No Known Allergies  Current Outpatient Medications on File Prior to Visit  Medication Sig Dispense Refill  . atorvastatin (LIPITOR) 40 MG tablet TAKE 1 TABLET (40 MG TOTAL) BY MOUTH EVERY MORNING. 90 tablet 3  . sildenafil (VIAGRA) 100 MG tablet Take 1 tablet (100 mg total) by mouth daily as needed for erectile dysfunction. 11 tablet 11   No current facility-administered medications on file prior to visit.    BP (!) 160/90   Pulse (!) 56   Temp 97.7 F (36.5 C) (Other (Comment))   Ht 5\' 9"  (1.753 m)    Wt 192 lb (87.1 kg)   SpO2 98%   BMI 28.35 kg/m       Objective:   Physical Exam Vitals and nursing note reviewed.  Constitutional:      Appearance: Normal appearance.  Cardiovascular:     Rate and Rhythm: Normal rate and regular rhythm.     Pulses: Normal pulses.     Heart sounds: Normal heart sounds.  Pulmonary:     Effort: Pulmonary effort is normal.     Breath sounds: Normal breath sounds.  Abdominal:     General: Abdomen is flat. Bowel sounds are normal. There is no distension.     Palpations: Abdomen is soft. There is no mass.     Tenderness: There is abdominal tenderness. There is no right CVA tenderness, left CVA tenderness, guarding or rebound. Negative signs include Murphy's sign, Rovsing's sign, McBurney's sign, psoas sign and obturator sign.     Hernia: No hernia is present.       Comments: During exam he was sat up from a supine position at which time this caused pretty significant discomfort to the right flank  Musculoskeletal:        General: Normal range of motion.  Skin:    General: Skin is warm and dry.     Capillary Refill: Capillary refill takes less than 2 seconds.  Neurological:     General: No focal deficit present.     Mental Status: He is alert and oriented to person, place, and time.  Psychiatric:        Mood and Affect: Mood normal.        Behavior: Behavior normal.        Thought Content: Thought content normal.        Judgment: Judgment normal.       Assessment & Plan:   1. Right flank pain -Appears muscular in origin.  No concern for gallbladder disease or appendicitis.  Cannot rule out possible kidney stone.  Will check x-ray of abdomen and pelvis.  Start on Flexeril and prednisone.  Consider CT in the future - DG Abd 1 View; Future - DG Chest 2 View; Future - cyclobenzaprine (FLEXERIL) 10 MG tablet; Take 1 tablet (10 mg total) by mouth at bedtime.  Dispense: 10 tablet; Refill: 0 - methylPREDNISolone (MEDROL DOSEPAK) 4 MG TBPK tablet;  Take as directed. Start in morning  Dispense: 21 tablet; Refill: 0 - DG Chest 2  View - DG Abd 1 View   [place], [place]

## 2019-05-04 NOTE — Patient Instructions (Signed)
Health Maintenance Due  Topic Date Due  . TETANUS/TDAP  04/28/2016  . PNA vac Low Risk Adult (2 of 2 - PPSV23) 03/18/2018  . INFLUENZA VACCINE  11/27/2018    No flowsheet data found.

## 2019-05-05 ENCOUNTER — Telehealth: Payer: Self-pay | Admitting: Adult Health

## 2019-05-05 NOTE — Telephone Encounter (Signed)
Message Routed to PCP CMA 

## 2019-05-05 NOTE — Telephone Encounter (Signed)
Patient states he is returning a call to [facility] for imaging results. Per FC, unavailable.

## 2019-05-06 ENCOUNTER — Other Ambulatory Visit: Payer: Self-pay | Admitting: Adult Health

## 2019-05-06 MED FILL — METHYLPREDNISOLONE 4 MG TAB: 4 | 6 days supply | Qty: 21 | Fill #0 | Status: TO

## 2019-05-06 MED FILL — CYCLOBENZAPRINE 10 MG TAB: 10 | 10 days supply | Qty: 10 | Fill #0 | Status: TO

## 2019-05-06 MED FILL — CYCLOBENZAPRINE HCL 10 MG T: 10 | 10 days supply | Qty: 10 | Fill #0

## 2019-05-06 MED FILL — METHYLPREDNISOLONE 4 MG TBP: 4 | 6 days supply | Qty: 21 | Fill #0

## 2019-05-10 ENCOUNTER — Other Ambulatory Visit: Payer: Self-pay

## 2019-05-11 ENCOUNTER — Other Ambulatory Visit: Payer: 59

## 2019-05-14 ENCOUNTER — Encounter: Payer: Self-pay | Admitting: Adult Health

## 2019-05-18 NOTE — Telephone Encounter (Signed)
Please advise 

## 2019-06-01 DIAGNOSIS — R739 Hyperglycemia, unspecified: Secondary | ICD-10-CM | POA: Diagnosis not present

## 2019-06-01 DIAGNOSIS — I10 Essential (primary) hypertension: Secondary | ICD-10-CM | POA: Diagnosis not present

## 2019-06-01 DIAGNOSIS — Z136 Encounter for screening for cardiovascular disorders: Secondary | ICD-10-CM | POA: Diagnosis not present

## 2019-06-01 DIAGNOSIS — E782 Mixed hyperlipidemia: Secondary | ICD-10-CM | POA: Diagnosis not present

## 2019-06-01 DIAGNOSIS — Z125 Encounter for screening for malignant neoplasm of prostate: Secondary | ICD-10-CM | POA: Diagnosis not present

## 2019-06-20 DIAGNOSIS — I10 Essential (primary) hypertension: Secondary | ICD-10-CM | POA: Diagnosis not present

## 2019-06-22 ENCOUNTER — Encounter: Payer: 59 | Admitting: Adult Health

## 2019-06-22 NOTE — Progress Notes (Deleted)
Subjective:    Patient ID: Cameron Aguilar, male    DOB: [birth year], 69 y.o.   MRN: [id number]  HPI Patient presents for yearly preventative medicine examination. He is a pleasant 69 year old male who  has a past medical history of Allergy and Hyperlipidemia.  Hyperlipidemia -takes Lipitor 40 mg daily.  He denies myalgia or fatigue Lab Results  Component Value Date   CHOL 202 (H) 01/22/2018   HDL 50.80 01/22/2018   LDLCALC 126 (H) 01/22/2018   LDLDIRECT 131.3 07/27/2012   TRIG 125.0 01/22/2018   CHOLHDL 4 01/22/2018   Elevated blood pressure readings blood pressures have been elevated over the last couple months.  He has been working on lifestyle modifications at home and monitoring his blood pressure and reports readings of BP Readings from Last 3 Encounters:  05/04/19 (!) 160/90  01/22/18 (!) 142/80  04/24/17 (!) 160/80   ED- takes Viagra as needed   All immunizations and health maintenance protocols were reviewed with the patient and needed orders were placed. Due for PPNV- 23, influenza, and tdap.   Appropriate screening laboratory values were ordered for the patient including screening of hyperlipidemia, renal function and hepatic function. If indicated by BPH, a PSA was ordered.  Medication reconciliation,  past medical history, social history, problem list and allergies were reviewed in detail with the patient  Goals were established with regard to weight loss, exercise, and  diet in compliance with medications  Wt Readings from Last 3 Encounters:  05/04/19 192 lb (87.1 kg)  01/22/18 202 lb 9.6 oz (91.9 kg)  04/24/17 204 lb (92.5 kg)    He will be due for his next colonoscopy in June 2021.  He is up-to-date on routine dental and vision screens  Review of Systems  Constitutional: Negative.   HENT: Negative.   Eyes: Negative.   Respiratory: Negative.   Cardiovascular: Negative.   Gastrointestinal: Negative.   Endocrine: Negative.   Genitourinary: Negative.     Musculoskeletal: Negative.   Skin: Negative.   Allergic/Immunologic: Negative.   Neurological: Negative.   Hematological: Negative.   Psychiatric/Behavioral: Negative.   All other systems reviewed and are negative.  Past Medical History:  Diagnosis Date  . Allergy   . Hyperlipidemia     Social History   Socioeconomic History  . Marital status: Married    Spouse name: Not on file  . Number of children: Not on file  . Years of education: Not on file  . Highest education level: Not on file  Occupational History  . Not on file  Tobacco Use  . Smoking status: Former Smoker    Quit date: 04/28/1978    Years since quitting: 41.1  . Smokeless tobacco: Never Used  Substance and Sexual Activity  . Alcohol use: Yes    Comment: 2 glasses per week  . Drug use: No  . Sexual activity: Not on file  Other Topics Concern  . Not on file  Social History Narrative   Works for [employer] in [employer]   Married for 77 years   Two sons [place], [place]: two dogs, horses.    Social Determinants of Health   Financial Resource Strain:   . Difficulty of Paying Living Expenses: Not on file  Food Insecurity:   . Worried About Charity fundraiser in the Last Year: Not on file  . Ran Out of Food in the Last Year: Not on file  Transportation Needs:   .  Lack of Transportation (Medical): Not on file  . Lack of Transportation (Non-Medical): Not on file  Physical Activity:   . Days of Exercise per Week: Not on file  . Minutes of Exercise per Session: Not on file  Stress:   . Feeling of Stress : Not on file  Social Connections:   . Frequency of Communication with Friends and Family: Not on file  . Frequency of Social Gatherings with Friends and Family: Not on file  . Attends Religious Services: Not on file  . Active Member of Clubs or Organizations: Not on file  . Attends Archivist Meetings: Not on file  . Marital Status: Not on file  Intimate Partner Violence:   .  Fear of Current or Ex-Partner: Not on file  . Emotionally Abused: Not on file  . Physically Abused: Not on file  . Sexually Abused: Not on file    Past Surgical History:  Procedure Laterality Date  . CYSTECTOMY     eyelid  . KNEE SURGERY      Family History  Problem Relation Age of Onset  . Cancer Father        stomach  . Heart disease Paternal Grandfather 50       deceased in 74s from heart disease    No Known Allergies  Current Outpatient Medications on File Prior to Visit  Medication Sig Dispense Refill  . atorvastatin (LIPITOR) 40 MG tablet TAKE 1 TABLET (40 MG TOTAL) BY MOUTH EVERY MORNING. 90 tablet 3  . cyclobenzaprine (FLEXERIL) 10 MG tablet Take 1 tablet (10 mg total) by mouth at bedtime. 10 tablet 0  . methylPREDNISolone (MEDROL DOSEPAK) 4 MG TBPK tablet Take as directed. Start in morning 21 tablet 0  . sildenafil (VIAGRA) 100 MG tablet Take 1 tablet (100 mg total) by mouth daily as needed for erectile dysfunction. 11 tablet 11   No current facility-administered medications on file prior to visit.    There were no vitals taken for this visit.      Objective:   Physical Exam Vitals and nursing note reviewed.  Constitutional:      General: He is not in acute distress.    Appearance: Normal appearance. He is well-developed and normal weight.  HENT:     Head: Normocephalic and atraumatic.     Right Ear: Tympanic membrane, ear canal and external ear normal. There is no impacted cerumen.     Left Ear: Tympanic membrane, ear canal and external ear normal. There is no impacted cerumen.     Nose: Nose normal. No congestion or rhinorrhea.     Mouth/Throat:     Mouth: Mucous membranes are moist.     Pharynx: Oropharynx is clear. No oropharyngeal exudate or posterior oropharyngeal erythema.  Eyes:     General:        Right eye: No discharge.        Left eye: No discharge.     Extraocular Movements: Extraocular movements intact.     Conjunctiva/sclera:  Conjunctivae normal.     Pupils: Pupils are equal, round, and reactive to light.  Neck:     Vascular: No carotid bruit.     Trachea: No tracheal deviation.  Cardiovascular:     Rate and Rhythm: Normal rate and regular rhythm.     Pulses: Normal pulses.     Heart sounds: Normal heart sounds. No murmur. No friction rub. No gallop.   Pulmonary:     Effort: Pulmonary effort is normal. No  respiratory distress.     Breath sounds: Normal breath sounds. No stridor. No wheezing, rhonchi or rales.  Chest:     Chest wall: No tenderness.  Abdominal:     General: Bowel sounds are normal. There is no distension.     Palpations: Abdomen is soft. There is no mass.     Tenderness: There is no abdominal tenderness. There is no right CVA tenderness, left CVA tenderness, guarding or rebound.     Hernia: No hernia is present.  Musculoskeletal:        General: No swelling, tenderness, deformity or signs of injury. Normal range of motion.     Right lower leg: No edema.     Left lower leg: No edema.  Lymphadenopathy:     Cervical: No cervical adenopathy.  Skin:    General: Skin is warm and dry.     Capillary Refill: Capillary refill takes less than 2 seconds.     Coloration: Skin is not jaundiced or pale.     Findings: No bruising, erythema, lesion or rash.  Neurological:     General: No focal deficit present.     Mental Status: He is alert and oriented to person, place, and time.     Cranial Nerves: No cranial nerve deficit.     Sensory: No sensory deficit.     Motor: No weakness.     Coordination: Coordination normal.     Gait: Gait normal.     Deep Tendon Reflexes: Reflexes normal.  Psychiatric:        Mood and Affect: Mood normal.        Behavior: Behavior normal.        Thought Content: Thought content normal.        Judgment: Judgment normal.       Assessment & Plan:

## 2019-06-29 DIAGNOSIS — I10 Essential (primary) hypertension: Secondary | ICD-10-CM | POA: Diagnosis not present

## 2019-06-29 DIAGNOSIS — R972 Elevated prostate specific antigen [PSA]: Secondary | ICD-10-CM | POA: Diagnosis not present

## 2019-06-29 DIAGNOSIS — Z87891 Personal history of nicotine dependence: Secondary | ICD-10-CM | POA: Diagnosis not present

## 2019-06-29 DIAGNOSIS — E782 Mixed hyperlipidemia: Secondary | ICD-10-CM | POA: Diagnosis not present

## 2019-06-29 DIAGNOSIS — N5201 Erectile dysfunction due to arterial insufficiency: Secondary | ICD-10-CM | POA: Diagnosis not present

## 2019-06-29 MED FILL — BENAZEPRIL HCL 20 MG TABLET: 20 | 90 days supply | Qty: 90 | Fill #0

## 2019-07-06 MED FILL — CHLORTHALIDONE 25 MG TABS: 25 | 30 days supply | Qty: 30 | Fill #0

## 2019-07-07 DIAGNOSIS — H2513 Age-related nuclear cataract, bilateral: Secondary | ICD-10-CM | POA: Diagnosis not present

## 2019-07-08 ENCOUNTER — Other Ambulatory Visit (HOSPITAL_COMMUNITY): Payer: Self-pay | Admitting: Internal Medicine

## 2019-07-09 MED FILL — ATORVASTATIN 40 MG TABLET: 40 | 90 days supply | Qty: 90 | Fill #0

## 2019-07-14 ENCOUNTER — Other Ambulatory Visit (HOSPITAL_COMMUNITY): Payer: Self-pay | Admitting: Internal Medicine

## 2019-07-14 DIAGNOSIS — L720 Epidermal cyst: Secondary | ICD-10-CM | POA: Diagnosis not present

## 2019-07-14 DIAGNOSIS — N529 Male erectile dysfunction, unspecified: Secondary | ICD-10-CM | POA: Diagnosis not present

## 2019-07-14 DIAGNOSIS — Z87891 Personal history of nicotine dependence: Secondary | ICD-10-CM | POA: Diagnosis not present

## 2019-07-14 DIAGNOSIS — I10 Essential (primary) hypertension: Secondary | ICD-10-CM | POA: Diagnosis not present

## 2019-07-14 MED FILL — AMLODIPINE 2.5 MG TABLET: 2.5 | 30 days supply | Qty: 30 | Fill #0

## 2019-07-14 MED FILL — EZETIMIBE 10 MG TABS: 10 | 90 days supply | Qty: 90 | Fill #0

## 2019-08-05 MED FILL — CHLORTHALIDONE 25 MG TABS: 25 | 30 days supply | Qty: 30 | Fill #1

## 2019-08-26 DIAGNOSIS — F4320 Adjustment disorder, unspecified: Secondary | ICD-10-CM | POA: Diagnosis not present

## 2019-08-27 DIAGNOSIS — F4320 Adjustment disorder, unspecified: Secondary | ICD-10-CM | POA: Diagnosis not present

## 2019-08-30 DIAGNOSIS — F4320 Adjustment disorder, unspecified: Secondary | ICD-10-CM | POA: Diagnosis not present

## 2019-11-16 DIAGNOSIS — N529 Male erectile dysfunction, unspecified: Secondary | ICD-10-CM | POA: Diagnosis not present

## 2019-11-16 DIAGNOSIS — R972 Elevated prostate specific antigen [PSA]: Secondary | ICD-10-CM | POA: Diagnosis not present

## 2019-12-06 MED FILL — AMLODIPINE 2.5 MG TABLET: 2.5 | 30 days supply | Qty: 30 | Fill #2

## 2019-12-06 MED FILL — CHLORTHALIDONE 25 MG TABS: 25 | 30 days supply | Qty: 30 | Fill #3

## 2019-12-06 MED FILL — ATORVASTATIN CALCIUM 40 MG: 40 | 90 days supply | Qty: 90 | Fill #1

## 2020-01-17 DIAGNOSIS — N529 Male erectile dysfunction, unspecified: Secondary | ICD-10-CM | POA: Diagnosis not present

## 2020-01-17 DIAGNOSIS — R972 Elevated prostate specific antigen [PSA]: Secondary | ICD-10-CM | POA: Diagnosis not present

## 2020-01-17 MED FILL — CIPROFLOXACIN HCL 500 MG TA: 500 | 1 days supply | Qty: 2 | Fill #0

## 2020-01-25 DIAGNOSIS — C61 Malignant neoplasm of prostate: Secondary | ICD-10-CM | POA: Diagnosis not present

## 2020-01-25 DIAGNOSIS — R972 Elevated prostate specific antigen [PSA]: Secondary | ICD-10-CM | POA: Diagnosis not present

## 2020-01-25 DIAGNOSIS — N4231 Prostatic intraepithelial neoplasia: Secondary | ICD-10-CM | POA: Diagnosis not present

## 2020-01-25 DIAGNOSIS — N529 Male erectile dysfunction, unspecified: Secondary | ICD-10-CM | POA: Diagnosis not present

## 2020-02-06 MED FILL — CHLORTHALIDONE 25 MG TABS: 25 | 30 days supply | Qty: 30 | Fill #4

## 2020-02-06 MED FILL — AMLODIPINE BESYLATE 2.5 MG: 2.5 | 30 days supply | Qty: 30 | Fill #3

## 2020-02-08 DIAGNOSIS — N529 Male erectile dysfunction, unspecified: Secondary | ICD-10-CM | POA: Diagnosis not present

## 2020-02-08 DIAGNOSIS — C61 Malignant neoplasm of prostate: Secondary | ICD-10-CM | POA: Diagnosis not present

## 2020-02-14 DIAGNOSIS — C61 Malignant neoplasm of prostate: Secondary | ICD-10-CM | POA: Diagnosis not present

## 2020-02-20 ENCOUNTER — Other Ambulatory Visit (HOSPITAL_COMMUNITY): Payer: Self-pay | Admitting: Radiology

## 2020-02-21 ENCOUNTER — Other Ambulatory Visit (HOSPITAL_COMMUNITY): Payer: Self-pay | Admitting: Radiology

## 2020-02-21 DIAGNOSIS — C61 Malignant neoplasm of prostate: Secondary | ICD-10-CM | POA: Diagnosis not present

## 2020-02-21 MED FILL — LIDOCAINE-PRILOCAINE CREAM: 2.5-2.5 | 30 days supply | Qty: 30 | Fill #0

## 2020-02-21 MED FILL — SULFAMETHOXAZOLE-TMP DS TAB: 800-160 | 4 days supply | Qty: 8 | Fill #0

## 2020-02-24 DIAGNOSIS — C61 Malignant neoplasm of prostate: Secondary | ICD-10-CM | POA: Diagnosis not present

## 2020-03-01 MED FILL — LORazepam 1 MG TABS: 1 | 1 days supply | Qty: 4 | Fill #0

## 2020-03-07 ENCOUNTER — Other Ambulatory Visit (HOSPITAL_COMMUNITY): Payer: Self-pay | Admitting: Radiology

## 2020-03-07 DIAGNOSIS — N400 Enlarged prostate without lower urinary tract symptoms: Secondary | ICD-10-CM | POA: Diagnosis not present

## 2020-03-07 DIAGNOSIS — C61 Malignant neoplasm of prostate: Secondary | ICD-10-CM | POA: Diagnosis not present

## 2020-03-07 MED FILL — TAMSULOSIN HCL 0.4 MG CAP: 0.4 | 30 days supply | Qty: 30 | Fill #0

## 2020-03-07 MED FILL — IBUPROFEN 800 MG TAB: 800 | 5 days supply | Qty: 20 | Fill #0

## 2020-03-14 DIAGNOSIS — C61 Malignant neoplasm of prostate: Secondary | ICD-10-CM | POA: Diagnosis not present

## 2020-03-26 MED FILL — AMLODIPINE BESYLATE 2.5 MG: 2.5 | 30 days supply | Qty: 30 | Fill #4

## 2020-03-26 MED FILL — CHLORTHALIDONE 25 MG TABS: 25 | 30 days supply | Qty: 30 | Fill #5

## 2020-04-17 DIAGNOSIS — C61 Malignant neoplasm of prostate: Secondary | ICD-10-CM | POA: Diagnosis not present

## 2020-04-17 DIAGNOSIS — N529 Male erectile dysfunction, unspecified: Secondary | ICD-10-CM | POA: Diagnosis not present

## 2020-05-07 MED FILL — ATORVASTATIN 40 MG TABLET: 40 | 90 days supply | Qty: 90 | Fill #2

## 2020-05-07 MED FILL — AMLODIPINE BESYLATE 2.5 MG: 2.5 | 30 days supply | Qty: 30 | Fill #5

## 2020-05-08 ENCOUNTER — Other Ambulatory Visit (HOSPITAL_COMMUNITY): Payer: Self-pay | Admitting: Internal Medicine

## 2020-05-08 MED FILL — EZETIMIBE 10 MG TABS: 10 | 90 days supply | Qty: 90 | Fill #0

## 2020-05-17 DIAGNOSIS — R5383 Other fatigue: Secondary | ICD-10-CM | POA: Diagnosis not present

## 2020-05-17 DIAGNOSIS — C61 Malignant neoplasm of prostate: Secondary | ICD-10-CM | POA: Diagnosis not present

## 2020-05-17 DIAGNOSIS — R931 Abnormal findings on diagnostic imaging of heart and coronary circulation: Secondary | ICD-10-CM | POA: Diagnosis not present

## 2020-05-17 DIAGNOSIS — S90851A Superficial foreign body, right foot, initial encounter: Secondary | ICD-10-CM | POA: Diagnosis not present

## 2020-05-17 DIAGNOSIS — E782 Mixed hyperlipidemia: Secondary | ICD-10-CM | POA: Diagnosis not present

## 2020-05-17 DIAGNOSIS — I10 Essential (primary) hypertension: Secondary | ICD-10-CM | POA: Diagnosis not present

## 2020-05-21 MED FILL — TAMSULOSIN HCL 0.4 MG CAP: 0.4 | 30 days supply | Qty: 30 | Fill #1

## 2020-05-22 ENCOUNTER — Other Ambulatory Visit (HOSPITAL_COMMUNITY): Payer: Self-pay | Admitting: Internal Medicine

## 2020-05-22 MED FILL — CHLORTHALIDONE 25 MG TABS: 25 | 90 days supply | Qty: 90 | Fill #0

## 2020-05-29 DIAGNOSIS — S90859A Superficial foreign body, unspecified foot, initial encounter: Secondary | ICD-10-CM | POA: Diagnosis not present

## 2020-05-29 DIAGNOSIS — S90851A Superficial foreign body, right foot, initial encounter: Secondary | ICD-10-CM | POA: Diagnosis not present

## 2020-05-29 DIAGNOSIS — M25571 Pain in right ankle and joints of right foot: Secondary | ICD-10-CM | POA: Diagnosis not present

## 2020-05-29 DIAGNOSIS — M79671 Pain in right foot: Secondary | ICD-10-CM | POA: Diagnosis not present

## 2020-05-30 DIAGNOSIS — S90851A Superficial foreign body, right foot, initial encounter: Secondary | ICD-10-CM | POA: Diagnosis not present

## 2020-05-30 DIAGNOSIS — M25571 Pain in right ankle and joints of right foot: Secondary | ICD-10-CM | POA: Diagnosis not present

## 2020-07-03 DIAGNOSIS — S299XXA Unspecified injury of thorax, initial encounter: Secondary | ICD-10-CM | POA: Diagnosis not present

## 2020-07-03 DIAGNOSIS — S298XXA Other specified injuries of thorax, initial encounter: Secondary | ICD-10-CM | POA: Diagnosis not present

## 2020-07-03 DIAGNOSIS — I4510 Unspecified right bundle-branch block: Secondary | ICD-10-CM | POA: Diagnosis not present

## 2020-07-03 DIAGNOSIS — S3993XA Unspecified injury of pelvis, initial encounter: Secondary | ICD-10-CM | POA: Diagnosis not present

## 2020-07-03 DIAGNOSIS — S3991XA Unspecified injury of abdomen, initial encounter: Secondary | ICD-10-CM | POA: Diagnosis not present

## 2020-07-03 DIAGNOSIS — R0902 Hypoxemia: Secondary | ICD-10-CM | POA: Diagnosis not present

## 2020-07-03 DIAGNOSIS — R42 Dizziness and giddiness: Secondary | ICD-10-CM | POA: Diagnosis not present

## 2020-07-03 DIAGNOSIS — S20311A Abrasion of right front wall of thorax, initial encounter: Secondary | ICD-10-CM | POA: Diagnosis not present

## 2020-07-06 MED FILL — TAMSULOSIN HCL 0.4 MG CAP: 0.4 | 30 days supply | Qty: 30 | Fill #2

## 2020-07-06 MED FILL — AMLODIPINE 2.5 MG TABLET: 2.5 | 30 days supply | Qty: 30 | Fill #6

## 2020-07-10 DIAGNOSIS — R0982 Postnasal drip: Secondary | ICD-10-CM | POA: Diagnosis not present

## 2020-07-10 DIAGNOSIS — S298XXD Other specified injuries of thorax, subsequent encounter: Secondary | ICD-10-CM | POA: Diagnosis not present

## 2020-07-17 DIAGNOSIS — N529 Male erectile dysfunction, unspecified: Secondary | ICD-10-CM | POA: Diagnosis not present

## 2020-07-17 DIAGNOSIS — C61 Malignant neoplasm of prostate: Secondary | ICD-10-CM | POA: Diagnosis not present

## 2020-07-20 ENCOUNTER — Other Ambulatory Visit (HOSPITAL_BASED_OUTPATIENT_CLINIC_OR_DEPARTMENT_OTHER): Payer: Self-pay

## 2020-09-04 ENCOUNTER — Other Ambulatory Visit (HOSPITAL_COMMUNITY): Payer: Self-pay

## 2020-09-04 ENCOUNTER — Other Ambulatory Visit: Payer: Self-pay

## 2020-09-04 ENCOUNTER — Other Ambulatory Visit (HOSPITAL_BASED_OUTPATIENT_CLINIC_OR_DEPARTMENT_OTHER): Payer: Self-pay

## 2020-09-04 MED FILL — Ezetimibe Tab 10 MG: ORAL | 90 days supply | Qty: 90 | Fill #0 | Status: AC

## 2020-09-04 MED FILL — Chlorthalidone Tab 25 MG: ORAL | 90 days supply | Qty: 90 | Fill #0 | Status: AC

## 2020-09-04 MED FILL — Tamsulosin HCl Cap 0.4 MG: ORAL | 30 days supply | Qty: 30 | Fill #0 | Status: AC

## 2020-09-06 ENCOUNTER — Other Ambulatory Visit: Payer: Self-pay

## 2020-09-06 ENCOUNTER — Other Ambulatory Visit (HOSPITAL_COMMUNITY): Payer: Self-pay

## 2020-09-07 ENCOUNTER — Other Ambulatory Visit (HOSPITAL_COMMUNITY): Payer: Self-pay

## 2020-09-12 ENCOUNTER — Other Ambulatory Visit (HOSPITAL_COMMUNITY): Payer: Self-pay

## 2020-09-12 ENCOUNTER — Other Ambulatory Visit: Payer: Self-pay

## 2020-09-13 ENCOUNTER — Other Ambulatory Visit (HOSPITAL_COMMUNITY): Payer: Self-pay

## 2020-09-13 ENCOUNTER — Other Ambulatory Visit: Payer: Self-pay | Admitting: Adult Health

## 2020-09-14 ENCOUNTER — Other Ambulatory Visit (HOSPITAL_COMMUNITY): Payer: Self-pay

## 2020-09-14 MED ORDER — AMLODIPINE BESYLATE 2.5 MG PO TABS
2.5000 mg | ORAL_TABLET | Freq: Every day | ORAL | 3 refills | Status: DC
  Filled 2020-09-14: qty 90, 90d supply, fill #0
  Filled 2021-01-25: qty 90, 90d supply, fill #1
  Filled 2021-05-29: qty 90, 90d supply, fill #2

## 2020-09-17 ENCOUNTER — Other Ambulatory Visit (HOSPITAL_COMMUNITY): Payer: Self-pay

## 2020-09-18 ENCOUNTER — Other Ambulatory Visit (HOSPITAL_COMMUNITY): Payer: Self-pay

## 2020-09-19 ENCOUNTER — Other Ambulatory Visit (HOSPITAL_COMMUNITY): Payer: Self-pay

## 2020-10-14 DIAGNOSIS — J320 Chronic maxillary sinusitis: Secondary | ICD-10-CM | POA: Diagnosis not present

## 2020-10-24 DIAGNOSIS — H6123 Impacted cerumen, bilateral: Secondary | ICD-10-CM | POA: Diagnosis not present

## 2020-10-24 DIAGNOSIS — H6692 Otitis media, unspecified, left ear: Secondary | ICD-10-CM | POA: Diagnosis not present

## 2020-10-25 ENCOUNTER — Other Ambulatory Visit (HOSPITAL_COMMUNITY): Payer: Self-pay

## 2020-10-25 ENCOUNTER — Other Ambulatory Visit (HOSPITAL_BASED_OUTPATIENT_CLINIC_OR_DEPARTMENT_OTHER): Payer: Self-pay

## 2020-10-25 MED ORDER — ATORVASTATIN CALCIUM 40 MG PO TABS
40.0000 mg | ORAL_TABLET | Freq: Every day | ORAL | 2 refills | Status: DC
  Filled 2020-10-25: qty 90, 90d supply, fill #0

## 2020-10-30 ENCOUNTER — Other Ambulatory Visit (HOSPITAL_COMMUNITY): Payer: Self-pay

## 2020-11-28 ENCOUNTER — Other Ambulatory Visit: Payer: Self-pay

## 2020-11-29 ENCOUNTER — Other Ambulatory Visit (HOSPITAL_COMMUNITY): Payer: Self-pay

## 2020-11-29 MED ORDER — TAMSULOSIN HCL 0.4 MG PO CAPS
ORAL_CAPSULE | ORAL | 3 refills | Status: AC
  Filled 2020-11-29: qty 90, 90d supply, fill #0
  Filled 2021-05-29: qty 90, 90d supply, fill #1

## 2020-11-30 ENCOUNTER — Other Ambulatory Visit (HOSPITAL_COMMUNITY): Payer: Self-pay

## 2020-12-12 ENCOUNTER — Telehealth: Payer: 59 | Admitting: Physician Assistant

## 2020-12-12 DIAGNOSIS — B9689 Other specified bacterial agents as the cause of diseases classified elsewhere: Secondary | ICD-10-CM | POA: Diagnosis not present

## 2020-12-12 DIAGNOSIS — J019 Acute sinusitis, unspecified: Secondary | ICD-10-CM | POA: Diagnosis not present

## 2020-12-12 MED ORDER — DOXYCYCLINE HYCLATE 100 MG PO TABS: 100.0000 mg | ORAL_TABLET | Freq: Two times a day (BID) | ORAL | 0 refills | Status: AC

## 2020-12-12 NOTE — Progress Notes (Signed)
Virtual Visit Consent   Cameron Aguilar, you are scheduled for a virtual visit with a [facility] provider today.     Just as with appointments in the office, your consent must be obtained to participate.  Your consent will be active for this visit and any virtual visit you may have with one of our providers in the next 365 days.     If you have a MyChart account, a copy of this consent can be sent to you electronically.  All virtual visits are billed to your insurance company just like a traditional visit in the office.    As this is a virtual visit, video technology does not allow for your provider to perform a traditional examination.  This may limit your provider's ability to fully assess your condition.  If your provider identifies any concerns that need to be evaluated in person or the need to arrange testing (such as labs, EKG, etc.), we will make arrangements to do so.     Although advances in technology are sophisticated, we cannot ensure that it will always work on either your end or our end.  If the connection with a video visit is poor, the visit may have to be switched to a telephone visit.  With either a video or telephone visit, we are not always able to ensure that we have a secure connection.     I need to obtain your verbal consent now.   Are you willing to proceed with your visit today?    Cameron Aguilar has provided verbal consent on 12/12/2020 for a virtual visit (video or telephone).   Leeanne Rio, [place]   Date: 12/12/2020 1:05 PM   Virtual Visit via Video Note   I, Leeanne Rio, connected with  Cameron Aguilar  ([id number], 1950-11-06) on 12/12/20 at  1:00 PM EDT by a video-enabled telemedicine application and verified that I am speaking with the correct person using two identifiers.  Location: Patient: Virtual Visit Location Patient: Home Provider: Virtual Visit Location Provider: Home Office   I discussed the limitations of evaluation and management by  telemedicine and the availability of in person appointments. The patient expressed understanding and agreed to proceed.    History of Present Illness: Cameron Aguilar is a 70 y.o. who identifies as a male who was assigned male at birth, and is being seen today for URI symptoms. Patient endorses having sinus pressure, scratchy throat, headache and maxillary sinus pain. Denies chest congestion but does note dry cough. Has had some ongoing symptoms over past 2 weeks that initially improved but did not fully resolve and now worsening over the past 4-5 days. Was treated at that time with Amoxicillin. Denies GI symptoms. Denies recent travel or sick contact. Has had negative COVID test. Is taking Sudafed AM and Nyquil PM.   HPI: HPI  Problems:  Patient Active Problem List   Diagnosis Date Noted   Hypersomnia 09/06/2015   Obese 09/06/2015   Hyperlipidemia 10/09/2009   Allergic rhinitis 10/09/2009   ELEVATED BLOOD PRESSURE WITHOUT DIAGNOSIS OF HYPERTENSION 10/09/2009    Allergies: No Known Allergies Medications:  Current Outpatient Medications:    amLODipine (NORVASC) 2.5 MG tablet, TAKE 1 TABLET DAILY FOR BLOOD PRESSURE, Disp: 30 tablet, Rfl: 11   amLODipine (NORVASC) 2.5 MG tablet, TAKE 1 TABLET BY MOUTH DAILY FOR BLOOD PRESSURE, Disp: 90 tablet, Rfl: 3   atorvastatin (LIPITOR) 40 MG tablet, TAKE 1 TABLET (40 MG TOTAL) BY MOUTH EVERY MORNING.,  Disp: 90 tablet, Rfl: 3   atorvastatin (LIPITOR) 40 MG tablet, TAKE 1 TABLET BY MOUTH AT BEDTIME, Disp: 90 tablet, Rfl: 3   atorvastatin (LIPITOR) 40 MG tablet, TAKE 1 TABLET BY MOUTH AT BEDTIME, Disp: 90 tablet, Rfl: 2   chlorthalidone (HYGROTON) 25 MG tablet, TAKE 1 TABLET BY MOUTH ONCE A DAY FOR BLOOD PRESSURE, Disp: 90 tablet, Rfl: 3   cyclobenzaprine (FLEXERIL) 10 MG tablet, Take 1 tablet (10 mg total) by mouth at bedtime., Disp: 10 tablet, Rfl: 0   ezetimibe (ZETIA) 10 MG tablet, TAKE 1 TABLET DAILY FOR CHOLESTEROL, Disp: 90 tablet, Rfl: 1   sildenafil  (VIAGRA) 100 MG tablet, Take 1 tablet (100 mg total) by mouth daily as needed for erectile dysfunction., Disp: 11 tablet, Rfl: 11   tamsulosin (FLOMAX) 0.4 MG CAPS capsule, TAKE 1 CAPSULE BY MOUTH DAILY 1 HOUR AFTER THE LAST MEAL OF THE DAY., Disp: 30 capsule, Rfl: 3   tamsulosin (FLOMAX) 0.4 MG CAPS capsule, Take 1 capsule by mouth once daily after dinner as directed, Disp: 90 capsule, Rfl: 3  Observations/Objective: Patient is well-developed, well-nourished in no acute distress.  Resting comfortably at home.  Head is normocephalic, atraumatic.  No labored breathing. Speech is clear and coherent with logical content.  Patient is alert and oriented at baseline.   Assessment and Plan: 1. Acute bacterial sinusitis Rx Doxycycline.  Increase fluids.  Rest.  Saline nasal spray.  Probiotic.  Mucinex as directed.  Humidifier in bedroom. OTC Nasal steroid recommended. Will have him stop Sudafed. Can start Coricidin HBP in its place.  Call or return to clinic if symptoms are not improving.   Follow Up Instructions: I discussed the assessment and treatment plan with the patient. The patient was provided an opportunity to ask questions and all were answered. The patient agreed with the plan and demonstrated an understanding of the instructions.  A copy of instructions were sent to the patient via MyChart.  The patient was advised to call back or seek an in-person evaluation if the symptoms worsen or if the condition fails to improve as anticipated.  Time:  I spent 15 minutes with the patient via telehealth technology discussing the above problems/concerns.    Leeanne Rio, PA-C

## 2020-12-12 NOTE — Patient Instructions (Signed)
Cameron Aguilar, thank you for joining Leeanne Rio, PA-C for today's virtual visit.  While this provider is not your primary care provider (PCP), if your PCP is located in our provider database this encounter information will be shared with them immediately following your visit.  Consent: (Patient) Cameron Aguilar provided verbal consent for this virtual visit at the beginning of the encounter.  Current Medications:  Current Outpatient Medications:    doxycycline (VIBRA-TABS) 100 MG tablet, Take 1 tablet (100 mg total) by mouth 2 (two) times daily., Disp: 14 tablet, Rfl: 0   amLODipine (NORVASC) 2.5 MG tablet, TAKE 1 TABLET BY MOUTH DAILY FOR BLOOD PRESSURE, Disp: 90 tablet, Rfl: 3   atorvastatin (LIPITOR) 40 MG tablet, TAKE 1 TABLET (40 MG TOTAL) BY MOUTH EVERY MORNING., Disp: 90 tablet, Rfl: 3   atorvastatin (LIPITOR) 40 MG tablet, TAKE 1 TABLET BY MOUTH AT BEDTIME, Disp: 90 tablet, Rfl: 3   chlorthalidone (HYGROTON) 25 MG tablet, TAKE 1 TABLET BY MOUTH ONCE A DAY FOR BLOOD PRESSURE, Disp: 90 tablet, Rfl: 3   cyclobenzaprine (FLEXERIL) 10 MG tablet, Take 1 tablet (10 mg total) by mouth at bedtime., Disp: 10 tablet, Rfl: 0   ezetimibe (ZETIA) 10 MG tablet, TAKE 1 TABLET DAILY FOR CHOLESTEROL, Disp: 90 tablet, Rfl: 1   sildenafil (VIAGRA) 100 MG tablet, Take 1 tablet (100 mg total) by mouth daily as needed for erectile dysfunction., Disp: 11 tablet, Rfl: 11   tamsulosin (FLOMAX) 0.4 MG CAPS capsule, Take 1 capsule by mouth once daily after dinner as directed, Disp: 90 capsule, Rfl: 3   Medications ordered in this encounter:  Meds ordered this encounter  Medications   doxycycline (VIBRA-TABS) 100 MG tablet    Sig: Take 1 tablet (100 mg total) by mouth 2 (two) times daily.    Dispense:  14 tablet    Refill:  0    Order Specific Question:   Supervising Provider    Answer:   Sabra Heck, BRIAN [[id number]]     *If you need refills on other medications prior to your next appointment, please  contact your pharmacy*  Follow-Up: Call back or seek an in-person evaluation if the symptoms worsen or if the condition fails to improve as anticipated.  Other Instructions Please take antibiotic as directed.  Increase fluid intake.  Use Saline nasal spray.  Take a daily multivitamin. Use an OTC nasal steroid like Flonase or Nasacort. Limit Sudafed use due to history of high BP. You can use Coricidin HBP instead.  Place a humidifier in the bedroom.  Please call or return clinic if symptoms are not improving.  Sinusitis Sinusitis is redness, soreness, and swelling (inflammation) of the paranasal sinuses. Paranasal sinuses are air pockets within the bones of your face (beneath the eyes, the middle of the forehead, or above the eyes). In healthy paranasal sinuses, mucus is able to drain out, and air is able to circulate through them by way of your nose. However, when your paranasal sinuses are inflamed, mucus and air can become trapped. This can allow bacteria and other germs to grow and cause infection. Sinusitis can develop quickly and last only a short time (acute) or continue over a long period (chronic). Sinusitis that lasts for more than 12 weeks is considered chronic.  CAUSES  Causes of sinusitis include: Allergies. Structural abnormalities, such as displacement of the cartilage that separates your nostrils (deviated septum), which can decrease the air flow through your nose and sinuses and affect sinus drainage. Functional  abnormalities, such as when the small hairs (cilia) that line your sinuses and help remove mucus do not work properly or are not present. SYMPTOMS  Symptoms of acute and chronic sinusitis are the same. The primary symptoms are pain and pressure around the affected sinuses. Other symptoms include: Upper toothache. Earache. Headache. Bad breath. Decreased sense of smell and taste. A cough, which worsens when you are lying flat. Fatigue. Fever. Thick drainage from your  nose, which often is green and may contain pus (purulent). Swelling and warmth over the affected sinuses. DIAGNOSIS  Your caregiver will perform a physical exam. During the exam, your caregiver may: Look in your nose for signs of abnormal growths in your nostrils (nasal polyps). Tap over the affected sinus to check for signs of infection. View the inside of your sinuses (endoscopy) with a special imaging device with a light attached (endoscope), which is inserted into your sinuses. If your caregiver suspects that you have chronic sinusitis, one or more of the following tests may be recommended: Allergy tests. Nasal culture A sample of mucus is taken from your nose and sent to a lab and screened for bacteria. Nasal cytology A sample of mucus is taken from your nose and examined by your caregiver to determine if your sinusitis is related to an allergy. TREATMENT  Most cases of acute sinusitis are related to a viral infection and will resolve on their own within 10 days. Sometimes medicines are prescribed to help relieve symptoms (pain medicine, decongestants, nasal steroid sprays, or saline sprays).  However, for sinusitis related to a bacterial infection, your caregiver will prescribe antibiotic medicines. These are medicines that will help kill the bacteria causing the infection.  Rarely, sinusitis is caused by a fungal infection. In theses cases, your caregiver will prescribe antifungal medicine. For some cases of chronic sinusitis, surgery is needed. Generally, these are cases in which sinusitis recurs more than 3 times per year, despite other treatments. HOME CARE INSTRUCTIONS  Drink plenty of water. Water helps thin the mucus so your sinuses can drain more easily. Use a humidifier. Inhale steam 3 to 4 times a day (for example, sit in the bathroom with the shower running). Apply a warm, moist washcloth to your face 3 to 4 times a day, or as directed by your caregiver. Use saline nasal sprays  to help moisten and clean your sinuses. Take over-the-counter or prescription medicines for pain, discomfort, or fever only as directed by your caregiver. SEEK IMMEDIATE MEDICAL CARE IF: You have increasing pain or severe headaches. You have nausea, vomiting, or drowsiness. You have swelling around your face. You have vision problems. You have a stiff neck. You have difficulty breathing. MAKE SURE YOU:  Understand these instructions. Will watch your condition. Will get help right away if you are not doing well or get worse. Document Released: 04/14/2005 Document Revised: 07/07/2011 Document Reviewed: 04/29/2011 [facility] Patient Information 2014 [place], [place].    If you have been instructed to have an in-person evaluation today at a local Urgent Care facility, please use the link below. It will take you to a list of all of our available [facility], including address, phone number and hours of operation. Please do not delay care.  [facility]  If you or a family member do not have a primary care provider, use the link below to schedule a visit and establish care. When you choose a [facility] primary care physician or advanced practice provider, you gain a  long-term partner in health. Find a Primary Care Provider  Learn more about [facility]'s in-office and virtual care options: [facility]

## 2021-01-02 DIAGNOSIS — R053 Chronic cough: Secondary | ICD-10-CM | POA: Diagnosis not present

## 2021-01-02 DIAGNOSIS — E782 Mixed hyperlipidemia: Secondary | ICD-10-CM | POA: Diagnosis not present

## 2021-01-02 DIAGNOSIS — Z8709 Personal history of other diseases of the respiratory system: Secondary | ICD-10-CM | POA: Diagnosis not present

## 2021-01-02 DIAGNOSIS — I700 Atherosclerosis of aorta: Secondary | ICD-10-CM | POA: Diagnosis not present

## 2021-01-02 DIAGNOSIS — I10 Essential (primary) hypertension: Secondary | ICD-10-CM | POA: Diagnosis not present

## 2021-01-02 DIAGNOSIS — R972 Elevated prostate specific antigen [PSA]: Secondary | ICD-10-CM | POA: Diagnosis not present

## 2021-01-02 DIAGNOSIS — C61 Malignant neoplasm of prostate: Secondary | ICD-10-CM | POA: Diagnosis not present

## 2021-01-15 DIAGNOSIS — M79673 Pain in unspecified foot: Secondary | ICD-10-CM | POA: Diagnosis not present

## 2021-01-15 DIAGNOSIS — M79671 Pain in right foot: Secondary | ICD-10-CM | POA: Diagnosis not present

## 2021-01-15 DIAGNOSIS — M79672 Pain in left foot: Secondary | ICD-10-CM | POA: Diagnosis not present

## 2021-01-22 DIAGNOSIS — C61 Malignant neoplasm of prostate: Secondary | ICD-10-CM | POA: Diagnosis not present

## 2021-01-22 DIAGNOSIS — N529 Male erectile dysfunction, unspecified: Secondary | ICD-10-CM | POA: Diagnosis not present

## 2021-01-25 ENCOUNTER — Other Ambulatory Visit (HOSPITAL_COMMUNITY): Payer: Self-pay

## 2021-01-25 MED FILL — Chlorthalidone Tab 25 MG: ORAL | 90 days supply | Qty: 90 | Fill #1 | Status: AC

## 2021-01-30 DIAGNOSIS — R222 Localized swelling, mass and lump, trunk: Secondary | ICD-10-CM | POA: Diagnosis not present

## 2021-01-30 DIAGNOSIS — L814 Other melanin hyperpigmentation: Secondary | ICD-10-CM | POA: Diagnosis not present

## 2021-01-30 DIAGNOSIS — L578 Other skin changes due to chronic exposure to nonionizing radiation: Secondary | ICD-10-CM | POA: Diagnosis not present

## 2021-01-30 DIAGNOSIS — C44712 Basal cell carcinoma of skin of right lower limb, including hip: Secondary | ICD-10-CM | POA: Diagnosis not present

## 2021-01-30 DIAGNOSIS — L821 Other seborrheic keratosis: Secondary | ICD-10-CM | POA: Diagnosis not present

## 2021-01-30 DIAGNOSIS — D485 Neoplasm of uncertain behavior of skin: Secondary | ICD-10-CM | POA: Diagnosis not present

## 2021-01-30 DIAGNOSIS — D225 Melanocytic nevi of trunk: Secondary | ICD-10-CM | POA: Diagnosis not present

## 2021-01-31 DIAGNOSIS — L905 Scar conditions and fibrosis of skin: Secondary | ICD-10-CM | POA: Diagnosis not present

## 2021-02-07 ENCOUNTER — Other Ambulatory Visit: Payer: Self-pay

## 2021-02-07 ENCOUNTER — Other Ambulatory Visit (HOSPITAL_COMMUNITY): Payer: Self-pay

## 2021-02-08 ENCOUNTER — Other Ambulatory Visit (HOSPITAL_COMMUNITY): Payer: Self-pay

## 2021-02-11 ENCOUNTER — Other Ambulatory Visit (HOSPITAL_COMMUNITY): Payer: Self-pay

## 2021-02-11 MED ORDER — EZETIMIBE 10 MG PO TABS
ORAL_TABLET | Freq: Every day | ORAL | 1 refills | Status: DC
  Filled 2021-02-11: qty 90, 90d supply, fill #0
  Filled 2021-05-29: qty 90, 90d supply, fill #1

## 2021-02-14 DIAGNOSIS — L905 Scar conditions and fibrosis of skin: Secondary | ICD-10-CM | POA: Diagnosis not present

## 2021-02-14 DIAGNOSIS — C44712 Basal cell carcinoma of skin of right lower limb, including hip: Secondary | ICD-10-CM | POA: Diagnosis not present

## 2021-03-01 DIAGNOSIS — L089 Local infection of the skin and subcutaneous tissue, unspecified: Secondary | ICD-10-CM | POA: Diagnosis not present

## 2021-03-14 DIAGNOSIS — L089 Local infection of the skin and subcutaneous tissue, unspecified: Secondary | ICD-10-CM | POA: Diagnosis not present

## 2021-03-14 DIAGNOSIS — Z48817 Encounter for surgical aftercare following surgery on the skin and subcutaneous tissue: Secondary | ICD-10-CM | POA: Diagnosis not present

## 2021-04-10 DIAGNOSIS — M79671 Pain in right foot: Secondary | ICD-10-CM | POA: Diagnosis not present

## 2021-04-10 DIAGNOSIS — M79673 Pain in unspecified foot: Secondary | ICD-10-CM | POA: Diagnosis not present

## 2021-04-10 DIAGNOSIS — M79672 Pain in left foot: Secondary | ICD-10-CM | POA: Diagnosis not present

## 2021-05-11 DIAGNOSIS — J320 Chronic maxillary sinusitis: Secondary | ICD-10-CM | POA: Diagnosis not present

## 2021-05-29 ENCOUNTER — Other Ambulatory Visit (HOSPITAL_COMMUNITY): Payer: Self-pay

## 2021-05-29 ENCOUNTER — Other Ambulatory Visit: Payer: Self-pay

## 2021-05-30 ENCOUNTER — Other Ambulatory Visit (HOSPITAL_COMMUNITY): Payer: Self-pay

## 2021-05-31 DIAGNOSIS — N529 Male erectile dysfunction, unspecified: Secondary | ICD-10-CM | POA: Diagnosis not present

## 2021-06-04 ENCOUNTER — Other Ambulatory Visit (HOSPITAL_COMMUNITY): Payer: Self-pay

## 2021-06-06 ENCOUNTER — Other Ambulatory Visit (HOSPITAL_COMMUNITY): Payer: Self-pay

## 2021-06-07 DIAGNOSIS — R208 Other disturbances of skin sensation: Secondary | ICD-10-CM | POA: Diagnosis not present

## 2021-06-07 DIAGNOSIS — R222 Localized swelling, mass and lump, trunk: Secondary | ICD-10-CM | POA: Diagnosis not present

## 2021-06-07 DIAGNOSIS — L720 Epidermal cyst: Secondary | ICD-10-CM | POA: Diagnosis not present

## 2021-06-14 ENCOUNTER — Other Ambulatory Visit (HOSPITAL_COMMUNITY): Payer: Self-pay

## 2021-06-19 ENCOUNTER — Other Ambulatory Visit (HOSPITAL_COMMUNITY): Payer: Self-pay

## 2021-06-20 ENCOUNTER — Other Ambulatory Visit: Payer: Self-pay

## 2021-06-20 ENCOUNTER — Other Ambulatory Visit (HOSPITAL_COMMUNITY): Payer: Self-pay

## 2021-06-28 ENCOUNTER — Other Ambulatory Visit (HOSPITAL_COMMUNITY): Payer: Self-pay

## 2021-07-03 ENCOUNTER — Other Ambulatory Visit (HOSPITAL_COMMUNITY): Payer: Self-pay

## 2021-07-03 MED ORDER — CHLORTHALIDONE 25 MG PO TABS
25.0000 mg | ORAL_TABLET | Freq: Every day | ORAL | 3 refills | Status: AC
  Filled 2021-07-03: qty 90, 90d supply, fill #0
  Filled 2021-11-28: qty 90, 90d supply, fill #1
  Filled 2022-03-17: qty 90, 90d supply, fill #2

## 2021-07-03 MED ORDER — ATORVASTATIN CALCIUM 40 MG PO TABS
40.0000 mg | ORAL_TABLET | Freq: Every day | ORAL | 2 refills | Status: AC
  Filled 2021-07-03: qty 90, 90d supply, fill #0
  Filled 2021-11-28: qty 90, 90d supply, fill #1
  Filled 2022-03-17: qty 90, 90d supply, fill #2

## 2021-07-04 ENCOUNTER — Other Ambulatory Visit (HOSPITAL_COMMUNITY): Payer: Self-pay

## 2021-07-18 DIAGNOSIS — N5236 Erectile dysfunction following interstitial seed therapy: Secondary | ICD-10-CM | POA: Diagnosis not present

## 2021-07-18 DIAGNOSIS — N390 Urinary tract infection, site not specified: Secondary | ICD-10-CM | POA: Diagnosis not present

## 2021-07-22 DIAGNOSIS — R918 Other nonspecific abnormal finding of lung field: Secondary | ICD-10-CM | POA: Diagnosis not present

## 2021-08-06 DIAGNOSIS — R3915 Urgency of urination: Secondary | ICD-10-CM | POA: Diagnosis not present

## 2021-08-06 DIAGNOSIS — C61 Malignant neoplasm of prostate: Secondary | ICD-10-CM | POA: Diagnosis not present

## 2021-08-06 DIAGNOSIS — N529 Male erectile dysfunction, unspecified: Secondary | ICD-10-CM | POA: Diagnosis not present

## 2021-08-27 DIAGNOSIS — I10 Essential (primary) hypertension: Secondary | ICD-10-CM | POA: Diagnosis not present

## 2021-08-27 DIAGNOSIS — Z923 Personal history of irradiation: Secondary | ICD-10-CM | POA: Diagnosis not present

## 2021-08-27 DIAGNOSIS — N5236 Erectile dysfunction following interstitial seed therapy: Secondary | ICD-10-CM | POA: Diagnosis not present

## 2021-08-27 DIAGNOSIS — N5201 Erectile dysfunction due to arterial insufficiency: Secondary | ICD-10-CM | POA: Diagnosis not present

## 2021-08-27 DIAGNOSIS — Z79899 Other long term (current) drug therapy: Secondary | ICD-10-CM | POA: Diagnosis not present

## 2021-08-27 DIAGNOSIS — Z87891 Personal history of nicotine dependence: Secondary | ICD-10-CM | POA: Diagnosis not present

## 2021-08-27 DIAGNOSIS — N390 Urinary tract infection, site not specified: Secondary | ICD-10-CM | POA: Diagnosis not present

## 2021-09-04 DIAGNOSIS — L578 Other skin changes due to chronic exposure to nonionizing radiation: Secondary | ICD-10-CM | POA: Diagnosis not present

## 2021-09-04 DIAGNOSIS — D225 Melanocytic nevi of trunk: Secondary | ICD-10-CM | POA: Diagnosis not present

## 2021-09-04 DIAGNOSIS — L570 Actinic keratosis: Secondary | ICD-10-CM | POA: Diagnosis not present

## 2021-09-04 DIAGNOSIS — D485 Neoplasm of uncertain behavior of skin: Secondary | ICD-10-CM | POA: Diagnosis not present

## 2021-09-04 DIAGNOSIS — L738 Other specified follicular disorders: Secondary | ICD-10-CM | POA: Diagnosis not present

## 2021-09-04 DIAGNOSIS — Z08 Encounter for follow-up examination after completed treatment for malignant neoplasm: Secondary | ICD-10-CM | POA: Diagnosis not present

## 2021-09-04 DIAGNOSIS — Z85828 Personal history of other malignant neoplasm of skin: Secondary | ICD-10-CM | POA: Diagnosis not present

## 2021-09-04 DIAGNOSIS — L814 Other melanin hyperpigmentation: Secondary | ICD-10-CM | POA: Diagnosis not present

## 2021-09-04 DIAGNOSIS — L821 Other seborrheic keratosis: Secondary | ICD-10-CM | POA: Diagnosis not present

## 2021-09-12 DIAGNOSIS — Z428 Encounter for other plastic and reconstructive surgery following medical procedure or healed injury: Secondary | ICD-10-CM | POA: Diagnosis not present

## 2021-09-12 DIAGNOSIS — N5236 Erectile dysfunction following interstitial seed therapy: Secondary | ICD-10-CM | POA: Diagnosis not present

## 2021-10-09 DIAGNOSIS — Z428 Encounter for other plastic and reconstructive surgery following medical procedure or healed injury: Secondary | ICD-10-CM | POA: Diagnosis not present

## 2021-10-09 DIAGNOSIS — N5236 Erectile dysfunction following interstitial seed therapy: Secondary | ICD-10-CM | POA: Diagnosis not present

## 2021-11-28 ENCOUNTER — Other Ambulatory Visit (HOSPITAL_COMMUNITY): Payer: Self-pay

## 2021-11-28 MED ORDER — EZETIMIBE 10 MG PO TABS
10.0000 mg | ORAL_TABLET | Freq: Every day | ORAL | 0 refills | Status: AC
  Filled 2021-11-28: qty 90, 90d supply, fill #0

## 2021-11-28 MED ORDER — AMLODIPINE BESYLATE 2.5 MG PO TABS
2.5000 mg | ORAL_TABLET | Freq: Every day | ORAL | 0 refills | Status: AC
  Filled 2021-11-28: qty 90, 90d supply, fill #0

## 2022-02-03 DIAGNOSIS — L820 Inflamed seborrheic keratosis: Secondary | ICD-10-CM | POA: Diagnosis not present

## 2022-02-03 DIAGNOSIS — D485 Neoplasm of uncertain behavior of skin: Secondary | ICD-10-CM | POA: Diagnosis not present

## 2022-02-03 DIAGNOSIS — L821 Other seborrheic keratosis: Secondary | ICD-10-CM | POA: Diagnosis not present

## 2022-02-03 DIAGNOSIS — D225 Melanocytic nevi of trunk: Secondary | ICD-10-CM | POA: Diagnosis not present

## 2022-02-03 DIAGNOSIS — L858 Other specified epidermal thickening: Secondary | ICD-10-CM | POA: Diagnosis not present

## 2022-02-03 DIAGNOSIS — L538 Other specified erythematous conditions: Secondary | ICD-10-CM | POA: Diagnosis not present

## 2022-02-03 DIAGNOSIS — R208 Other disturbances of skin sensation: Secondary | ICD-10-CM | POA: Diagnosis not present

## 2022-02-03 DIAGNOSIS — L298 Other pruritus: Secondary | ICD-10-CM | POA: Diagnosis not present

## 2022-02-03 DIAGNOSIS — Z85828 Personal history of other malignant neoplasm of skin: Secondary | ICD-10-CM | POA: Diagnosis not present

## 2022-02-03 DIAGNOSIS — L578 Other skin changes due to chronic exposure to nonionizing radiation: Secondary | ICD-10-CM | POA: Diagnosis not present

## 2022-02-05 DIAGNOSIS — M79671 Pain in right foot: Secondary | ICD-10-CM | POA: Diagnosis not present

## 2022-02-11 DIAGNOSIS — N401 Enlarged prostate with lower urinary tract symptoms: Secondary | ICD-10-CM | POA: Diagnosis not present

## 2022-02-11 DIAGNOSIS — N529 Male erectile dysfunction, unspecified: Secondary | ICD-10-CM | POA: Diagnosis not present

## 2022-02-11 DIAGNOSIS — C61 Malignant neoplasm of prostate: Secondary | ICD-10-CM | POA: Diagnosis not present

## 2022-03-17 ENCOUNTER — Other Ambulatory Visit (HOSPITAL_COMMUNITY): Payer: Self-pay

## 2022-03-17 MED ORDER — AMLODIPINE BESYLATE 2.5 MG PO TABS
2.5000 mg | ORAL_TABLET | Freq: Every day | ORAL | 0 refills | Status: AC
  Filled 2022-03-17: qty 90, 90d supply, fill #0

## 2022-03-17 MED ORDER — EZETIMIBE 10 MG PO TABS
10.0000 mg | ORAL_TABLET | Freq: Every day | ORAL | 0 refills | Status: AC
  Filled 2022-03-17: qty 90, 90d supply, fill #0

## 2022-04-02 DIAGNOSIS — M76821 Posterior tibial tendinitis, right leg: Secondary | ICD-10-CM | POA: Diagnosis not present

## 2024-04-30 ENCOUNTER — Other Ambulatory Visit: Payer: Self-pay
